# Patient Record
Sex: Female | Born: 1970 | Race: White | Hispanic: No | Marital: Married
Health system: Southern US, Community
[De-identification: ages and names within clinical notes are randomized; demographics above are authoritative.]

## PROBLEM LIST (undated history)

## (undated) DIAGNOSIS — G2581 Restless legs syndrome: Secondary | ICD-10-CM

## (undated) DIAGNOSIS — Z803 Family history of malignant neoplasm of breast: Secondary | ICD-10-CM

## (undated) DIAGNOSIS — K8020 Calculus of gallbladder without cholecystitis without obstruction: Secondary | ICD-10-CM

## (undated) DIAGNOSIS — K219 Gastro-esophageal reflux disease without esophagitis: Secondary | ICD-10-CM

## (undated) DIAGNOSIS — Z801 Family history of malignant neoplasm of trachea, bronchus and lung: Secondary | ICD-10-CM

## (undated) DIAGNOSIS — Z808 Family history of malignant neoplasm of other organs or systems: Secondary | ICD-10-CM

## (undated) DIAGNOSIS — Z8042 Family history of malignant neoplasm of prostate: Secondary | ICD-10-CM

## (undated) HISTORY — DX: Family history of malignant neoplasm of prostate: Z80.42

## (undated) HISTORY — DX: Family history of malignant neoplasm of breast: Z80.3

## (undated) HISTORY — DX: Family history of malignant neoplasm of trachea, bronchus and lung: Z80.1

## (undated) HISTORY — DX: Calculus of gallbladder without cholecystitis without obstruction: K80.20

## (undated) HISTORY — PX: CHOLECYSTECTOMY: SHX55

## (undated) HISTORY — PX: GANGLION CYST EXCISION: SHX1691

## (undated) HISTORY — DX: Family history of malignant neoplasm of other organs or systems: Z80.8

---

## 1998-06-06 ENCOUNTER — Encounter (HOSPITAL_COMMUNITY): Admission: RE | Admit: 1998-06-06 | Discharge: 1998-07-04 | Payer: Self-pay | Admitting: Obstetrics and Gynecology

## 1998-06-12 ENCOUNTER — Ambulatory Visit (HOSPITAL_COMMUNITY): Admission: RE | Admit: 1998-06-12 | Discharge: 1998-06-12 | Payer: Self-pay | Admitting: Obstetrics and Gynecology

## 1998-07-02 ENCOUNTER — Inpatient Hospital Stay (HOSPITAL_COMMUNITY): Admission: AD | Admit: 1998-07-02 | Discharge: 1998-07-02 | Payer: Self-pay | Admitting: Obstetrics and Gynecology

## 1998-07-03 ENCOUNTER — Inpatient Hospital Stay (HOSPITAL_COMMUNITY): Admission: AD | Admit: 1998-07-03 | Discharge: 1998-07-05 | Payer: Self-pay | Admitting: Obstetrics and Gynecology

## 1998-07-07 ENCOUNTER — Encounter (HOSPITAL_COMMUNITY): Admission: RE | Admit: 1998-07-07 | Discharge: 1998-10-05 | Payer: Self-pay | Admitting: *Deleted

## 1999-07-06 ENCOUNTER — Emergency Department (HOSPITAL_COMMUNITY): Admission: EM | Admit: 1999-07-06 | Discharge: 1999-07-06 | Payer: Self-pay | Admitting: Emergency Medicine

## 1999-07-17 ENCOUNTER — Other Ambulatory Visit: Admission: RE | Admit: 1999-07-17 | Discharge: 1999-07-17 | Payer: Self-pay | Admitting: Obstetrics and Gynecology

## 2000-10-05 ENCOUNTER — Observation Stay (HOSPITAL_COMMUNITY): Admission: AD | Admit: 2000-10-05 | Discharge: 2000-10-06 | Payer: Self-pay | Admitting: *Deleted

## 2000-10-08 ENCOUNTER — Other Ambulatory Visit: Admission: RE | Admit: 2000-10-08 | Discharge: 2000-10-08 | Payer: Self-pay | Admitting: Obstetrics and Gynecology

## 2000-10-20 ENCOUNTER — Inpatient Hospital Stay (HOSPITAL_COMMUNITY): Admission: AD | Admit: 2000-10-20 | Discharge: 2000-10-20 | Payer: Self-pay | Admitting: Obstetrics and Gynecology

## 2000-11-26 ENCOUNTER — Ambulatory Visit (HOSPITAL_COMMUNITY): Admission: RE | Admit: 2000-11-26 | Discharge: 2000-11-26 | Payer: Self-pay | Admitting: Obstetrics and Gynecology

## 2000-11-26 ENCOUNTER — Encounter: Payer: Self-pay | Admitting: Obstetrics and Gynecology

## 2001-04-24 ENCOUNTER — Inpatient Hospital Stay (HOSPITAL_COMMUNITY): Admission: AD | Admit: 2001-04-24 | Discharge: 2001-04-26 | Payer: Self-pay | Admitting: Obstetrics and Gynecology

## 2002-10-01 ENCOUNTER — Other Ambulatory Visit: Admission: RE | Admit: 2002-10-01 | Discharge: 2002-10-01 | Payer: Self-pay | Admitting: Obstetrics and Gynecology

## 2003-11-03 ENCOUNTER — Other Ambulatory Visit: Admission: RE | Admit: 2003-11-03 | Discharge: 2003-11-03 | Payer: Self-pay | Admitting: Obstetrics and Gynecology

## 2004-11-14 ENCOUNTER — Other Ambulatory Visit: Admission: RE | Admit: 2004-11-14 | Discharge: 2004-11-14 | Payer: Self-pay | Admitting: Obstetrics and Gynecology

## 2005-12-02 ENCOUNTER — Other Ambulatory Visit: Admission: RE | Admit: 2005-12-02 | Discharge: 2005-12-02 | Payer: Self-pay | Admitting: Obstetrics and Gynecology

## 2007-07-07 ENCOUNTER — Encounter: Admission: RE | Admit: 2007-07-07 | Discharge: 2007-07-07 | Payer: Self-pay | Admitting: Obstetrics and Gynecology

## 2011-04-12 NOTE — Discharge Summary (Signed)
[facility]  Patient:    Kristina Hudson, Kristina Hudson                        MRN: [id number] Adm. Date:  [id number] Disc. Date: 04/26/01 Attending:  Michaele Offer                           Discharge Summary  ADMISSION DIAGNOSES:          Intrauterine pregnancy at 39 weeks.  DISCHARGE DIAGNOSES:          Intrauterine pregnancy at 39 weeks, delivered.  PROCEDURE:                    Spontaneous vaginal delivery.  COMPLICATIONS:                None.  CONSULTATIONS:                None.  HISTORY AND PHYSICAL:         This is a 40 year old white female gravida 2, para 1-0-0-2 with an EGA of 39+ weeks by an LMP consistent with a 9 week ultrasound with an EDC of June 4 who presents with a complaint of regular contractions without bleeding or ruptured membranes and with good fetal movement.  Prenatal care complicated by nausea and vomiting of pregnancy treated with Zofran, anemia treated with iron, and constipation treated with Colace.  She was also treated with a Z-pak at 24 weeks for persistent cough.  PRENATAL LABORATORIES:        Blood type O+.  Negative antibody screen.  RPR nonreactive.  Rubella immune.  Hepatitis B surface antigen negative.  HIV negative.  Gonorrhea and chlamydia negative.  Triple screen normal.  One hour glucola 96.  Group B strep is negative.  PAST OBSTETRICAL HISTORY:     In 1999 vaginal delivery at 39 weeks 5 pounds 12 ounces with no complications.  PAST GYNECOLOGICAL HISTORY:   Colposcopy in 1993.  PAST MEDICAL HISTORY:         Anemia.  PAST SURGICAL HISTORY:        Ganglion cyst of the left wrist.  ALLERGIES:                    CODEINE and IODINE.  MEDICATIONS:                  Zofran 8 mg approximately q.48h.  PHYSICAL EXAMINATION  VITAL SIGNS:                  She is afebrile with stable vital signs. Fetal heart tracing is reactive with contractions every two to three minutes.  ABDOMEN:                      Gravid,  nontender with a fundal height of 36 cm on May 23 and an estimated fetal weight of approximately 7 pounds.  PELVIC:                       Vaginal examination per the nurses was 5-6, 90, and a -1 on admission which progressed to 6, 90, and 0.  She had an intact bag of water and an adequate pelvis and a vertex presentation.  HOSPITAL COURSE:              Patient was admitted and received an epidural.  She continued to progress and had spontaneous rupture of membranes.  She reached complete, pushed well and on the afternoon of May 31 had an SVD of a vigorous female infant with Apgars of 8 and 9 that weighed 6 pounds 7 ounces. There was a nuchal cord x 1 that was reduced.  The placenta delivered spontaneous and was intact.  Perineum, cervix, and rectum were intact and estimated blood loss was approximately 200 cc.  Delivery was performed by Dr. Huel Cote.  Postpartum she did very well.  Breast-fed her baby with complication.  Remained afebrile.  On the morning of postpartum day #2 was felt to be stable enough for discharge home.  CONDITION ON DISCHARGE:       Stable.  DISPOSITION:                  Discharged to home.  DIET:                         Regular diet.  ACTIVITY:                     Pelvic rest.  FOLLOW-UP:                    Six weeks.  DISCHARGE MEDICATIONS:        Over-the-counter Motrin p.r.n.  DISCHARGE INSTRUCTIONS:       She is given our discharge pamphlet. DD:  04/26/01 TD:  04/26/01 Job: [id number]

## 2018-06-29 DIAGNOSIS — Z6829 Body mass index (BMI) 29.0-29.9, adult: Secondary | ICD-10-CM | POA: Diagnosis not present

## 2018-06-29 DIAGNOSIS — K117 Disturbances of salivary secretion: Secondary | ICD-10-CM | POA: Diagnosis not present

## 2018-06-29 DIAGNOSIS — B370 Candidal stomatitis: Secondary | ICD-10-CM | POA: Diagnosis not present

## 2018-07-22 DIAGNOSIS — K146 Glossodynia: Secondary | ICD-10-CM | POA: Diagnosis not present

## 2018-07-22 DIAGNOSIS — K140 Glossitis: Secondary | ICD-10-CM | POA: Diagnosis not present

## 2018-08-13 DIAGNOSIS — K146 Glossodynia: Secondary | ICD-10-CM | POA: Diagnosis not present

## 2018-08-13 DIAGNOSIS — G9009 Other idiopathic peripheral autonomic neuropathy: Secondary | ICD-10-CM | POA: Diagnosis not present

## 2018-08-13 DIAGNOSIS — B370 Candidal stomatitis: Secondary | ICD-10-CM | POA: Diagnosis not present

## 2018-08-13 DIAGNOSIS — G521 Disorders of glossopharyngeal nerve: Secondary | ICD-10-CM | POA: Diagnosis not present

## 2018-08-19 DIAGNOSIS — Z1231 Encounter for screening mammogram for malignant neoplasm of breast: Secondary | ICD-10-CM | POA: Diagnosis not present

## 2018-09-08 DIAGNOSIS — B370 Candidal stomatitis: Secondary | ICD-10-CM | POA: Diagnosis not present

## 2018-09-08 DIAGNOSIS — K146 Glossodynia: Secondary | ICD-10-CM | POA: Diagnosis not present

## 2018-09-08 DIAGNOSIS — K117 Disturbances of salivary secretion: Secondary | ICD-10-CM | POA: Diagnosis not present

## 2018-09-08 DIAGNOSIS — G521 Disorders of glossopharyngeal nerve: Secondary | ICD-10-CM | POA: Diagnosis not present

## 2018-09-14 DIAGNOSIS — Z23 Encounter for immunization: Secondary | ICD-10-CM | POA: Diagnosis not present

## 2019-02-17 DIAGNOSIS — L814 Other melanin hyperpigmentation: Secondary | ICD-10-CM | POA: Diagnosis not present

## 2019-02-17 DIAGNOSIS — D225 Melanocytic nevi of trunk: Secondary | ICD-10-CM | POA: Diagnosis not present

## 2019-02-17 DIAGNOSIS — Z808 Family history of malignant neoplasm of other organs or systems: Secondary | ICD-10-CM | POA: Diagnosis not present

## 2019-02-17 DIAGNOSIS — L821 Other seborrheic keratosis: Secondary | ICD-10-CM | POA: Diagnosis not present

## 2019-06-30 DIAGNOSIS — Z6829 Body mass index (BMI) 29.0-29.9, adult: Secondary | ICD-10-CM | POA: Diagnosis not present

## 2019-06-30 DIAGNOSIS — N870 Mild cervical dysplasia: Secondary | ICD-10-CM | POA: Diagnosis not present

## 2019-06-30 DIAGNOSIS — Z01419 Encounter for gynecological examination (general) (routine) without abnormal findings: Secondary | ICD-10-CM | POA: Diagnosis not present

## 2019-06-30 DIAGNOSIS — R05 Cough: Secondary | ICD-10-CM | POA: Diagnosis not present

## 2019-06-30 DIAGNOSIS — Z1231 Encounter for screening mammogram for malignant neoplasm of breast: Secondary | ICD-10-CM | POA: Diagnosis not present

## 2019-06-30 DIAGNOSIS — N3946 Mixed incontinence: Secondary | ICD-10-CM | POA: Diagnosis not present

## 2019-06-30 DIAGNOSIS — K219 Gastro-esophageal reflux disease without esophagitis: Secondary | ICD-10-CM | POA: Diagnosis not present

## 2019-08-10 DIAGNOSIS — R05 Cough: Secondary | ICD-10-CM | POA: Diagnosis not present

## 2019-08-10 DIAGNOSIS — K219 Gastro-esophageal reflux disease without esophagitis: Secondary | ICD-10-CM | POA: Diagnosis not present

## 2019-08-31 DIAGNOSIS — Z1231 Encounter for screening mammogram for malignant neoplasm of breast: Secondary | ICD-10-CM | POA: Diagnosis not present

## 2019-09-14 DIAGNOSIS — Z20828 Contact with and (suspected) exposure to other viral communicable diseases: Secondary | ICD-10-CM | POA: Diagnosis not present

## 2019-09-23 DIAGNOSIS — Z23 Encounter for immunization: Secondary | ICD-10-CM | POA: Diagnosis not present

## 2020-01-20 DIAGNOSIS — Z23 Encounter for immunization: Secondary | ICD-10-CM | POA: Diagnosis not present

## 2020-02-17 DIAGNOSIS — Z23 Encounter for immunization: Secondary | ICD-10-CM | POA: Diagnosis not present

## 2020-05-30 DIAGNOSIS — F419 Anxiety disorder, unspecified: Secondary | ICD-10-CM | POA: Diagnosis not present

## 2020-05-30 DIAGNOSIS — N649 Disorder of breast, unspecified: Secondary | ICD-10-CM | POA: Diagnosis not present

## 2020-05-30 DIAGNOSIS — G2581 Restless legs syndrome: Secondary | ICD-10-CM | POA: Diagnosis not present

## 2020-05-31 ENCOUNTER — Other Ambulatory Visit: Payer: Self-pay | Admitting: Family Medicine

## 2020-05-31 DIAGNOSIS — N649 Disorder of breast, unspecified: Secondary | ICD-10-CM

## 2020-06-19 ENCOUNTER — Other Ambulatory Visit: Payer: Self-pay

## 2020-07-04 DIAGNOSIS — D500 Iron deficiency anemia secondary to blood loss (chronic): Secondary | ICD-10-CM | POA: Diagnosis not present

## 2020-07-04 DIAGNOSIS — Z0000 Encounter for general adult medical examination without abnormal findings: Secondary | ICD-10-CM | POA: Diagnosis not present

## 2020-07-04 DIAGNOSIS — Z13220 Encounter for screening for lipoid disorders: Secondary | ICD-10-CM | POA: Diagnosis not present

## 2020-07-07 DIAGNOSIS — D500 Iron deficiency anemia secondary to blood loss (chronic): Secondary | ICD-10-CM | POA: Diagnosis not present

## 2020-08-07 DIAGNOSIS — Z20828 Contact with and (suspected) exposure to other viral communicable diseases: Secondary | ICD-10-CM | POA: Diagnosis not present

## 2020-08-28 ENCOUNTER — Other Ambulatory Visit: Payer: Self-pay | Admitting: Family Medicine

## 2020-08-28 DIAGNOSIS — Z1231 Encounter for screening mammogram for malignant neoplasm of breast: Secondary | ICD-10-CM

## 2020-09-20 ENCOUNTER — Ambulatory Visit
Admission: RE | Admit: 2020-09-20 | Discharge: 2020-09-20 | Disposition: A | Discharge status: Home / Self Care | Payer: BC Managed Care – PPO | Source: Ambulatory Visit | Attending: Family Medicine | Admitting: Family Medicine

## 2020-09-20 ENCOUNTER — Other Ambulatory Visit: Payer: Self-pay

## 2020-09-20 DIAGNOSIS — Z1231 Encounter for screening mammogram for malignant neoplasm of breast: Secondary | ICD-10-CM

## 2021-01-15 DIAGNOSIS — F419 Anxiety disorder, unspecified: Secondary | ICD-10-CM | POA: Diagnosis not present

## 2021-01-15 DIAGNOSIS — D500 Iron deficiency anemia secondary to blood loss (chronic): Secondary | ICD-10-CM | POA: Diagnosis not present

## 2021-07-11 DIAGNOSIS — G2581 Restless legs syndrome: Secondary | ICD-10-CM | POA: Diagnosis not present

## 2021-07-11 DIAGNOSIS — F419 Anxiety disorder, unspecified: Secondary | ICD-10-CM | POA: Diagnosis not present

## 2021-07-11 DIAGNOSIS — D500 Iron deficiency anemia secondary to blood loss (chronic): Secondary | ICD-10-CM | POA: Diagnosis not present

## 2021-07-11 DIAGNOSIS — L989 Disorder of the skin and subcutaneous tissue, unspecified: Secondary | ICD-10-CM | POA: Diagnosis not present

## 2021-07-11 DIAGNOSIS — Z13220 Encounter for screening for lipoid disorders: Secondary | ICD-10-CM | POA: Diagnosis not present

## 2021-07-11 DIAGNOSIS — Z0000 Encounter for general adult medical examination without abnormal findings: Secondary | ICD-10-CM | POA: Diagnosis not present

## 2021-07-11 DIAGNOSIS — Z1159 Encounter for screening for other viral diseases: Secondary | ICD-10-CM | POA: Diagnosis not present

## 2021-07-19 DIAGNOSIS — Z20822 Contact with and (suspected) exposure to covid-19: Secondary | ICD-10-CM | POA: Diagnosis not present

## 2021-07-19 DIAGNOSIS — J029 Acute pharyngitis, unspecified: Secondary | ICD-10-CM | POA: Diagnosis not present

## 2021-07-23 ENCOUNTER — Other Ambulatory Visit: Payer: Self-pay | Admitting: Family Medicine

## 2021-07-23 DIAGNOSIS — Z1231 Encounter for screening mammogram for malignant neoplasm of breast: Secondary | ICD-10-CM

## 2021-08-20 DIAGNOSIS — Z1211 Encounter for screening for malignant neoplasm of colon: Secondary | ICD-10-CM | POA: Diagnosis not present

## 2021-09-17 DIAGNOSIS — L821 Other seborrheic keratosis: Secondary | ICD-10-CM | POA: Diagnosis not present

## 2021-09-17 DIAGNOSIS — L568 Other specified acute skin changes due to ultraviolet radiation: Secondary | ICD-10-CM | POA: Diagnosis not present

## 2021-09-17 DIAGNOSIS — D225 Melanocytic nevi of trunk: Secondary | ICD-10-CM | POA: Diagnosis not present

## 2021-09-17 DIAGNOSIS — D485 Neoplasm of uncertain behavior of skin: Secondary | ICD-10-CM | POA: Diagnosis not present

## 2021-09-24 ENCOUNTER — Other Ambulatory Visit: Payer: Self-pay

## 2021-09-24 ENCOUNTER — Ambulatory Visit
Admission: RE | Admit: 2021-09-24 | Discharge: 2021-09-24 | Disposition: A | Discharge status: Home / Self Care | Payer: BC Managed Care – PPO | Source: Ambulatory Visit | Attending: Family Medicine | Admitting: Family Medicine

## 2021-09-24 DIAGNOSIS — Z1231 Encounter for screening mammogram for malignant neoplasm of breast: Secondary | ICD-10-CM

## 2022-04-16 IMAGING — MG MM DIGITAL SCREENING BILAT W/ TOMO AND CAD
8 series · 8 of 24 positions shown · non-contrast
Comparison: Previous exam(s).

CLINICAL DATA: Screening.

EXAM:
DIGITAL SCREENING BILATERAL MAMMOGRAM WITH TOMOSYNTHESIS AND CAD
TECHNIQUE: Bilateral screening digital craniocaudal and mediolateral oblique
mammograms were obtained. Bilateral screening digital breast
tomosynthesis was performed. The images were evaluated with
computer-aided detection.

[L CC synth-2D]
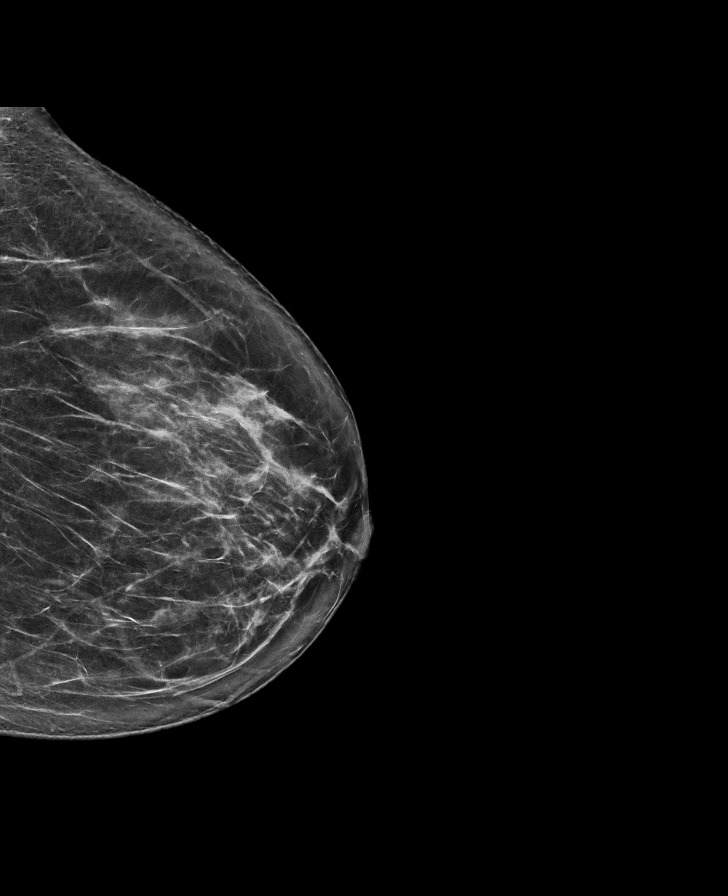

[R MLO synth-2D]
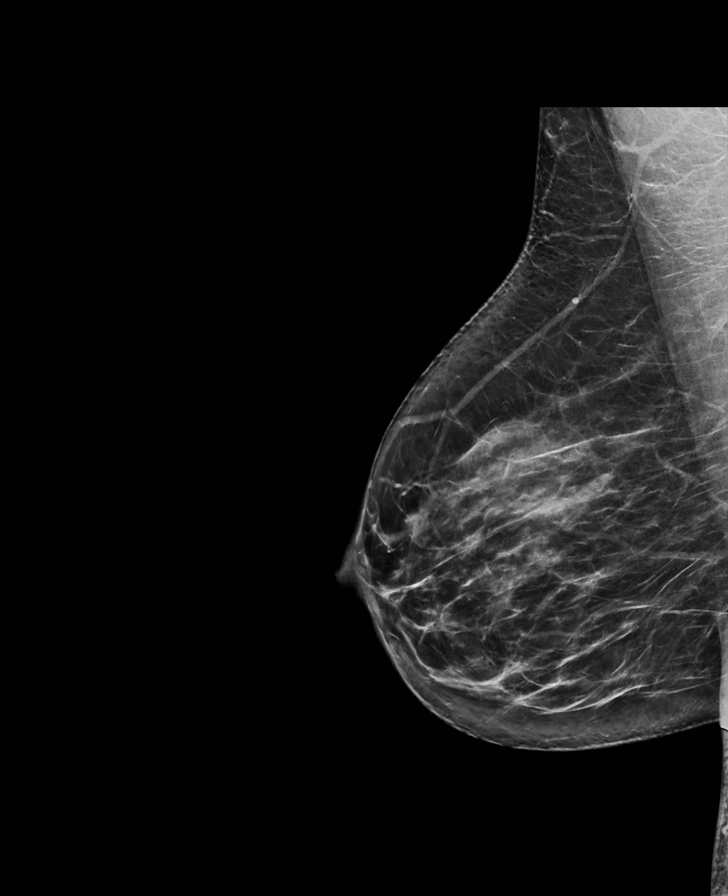

[L MLO synth-2D]
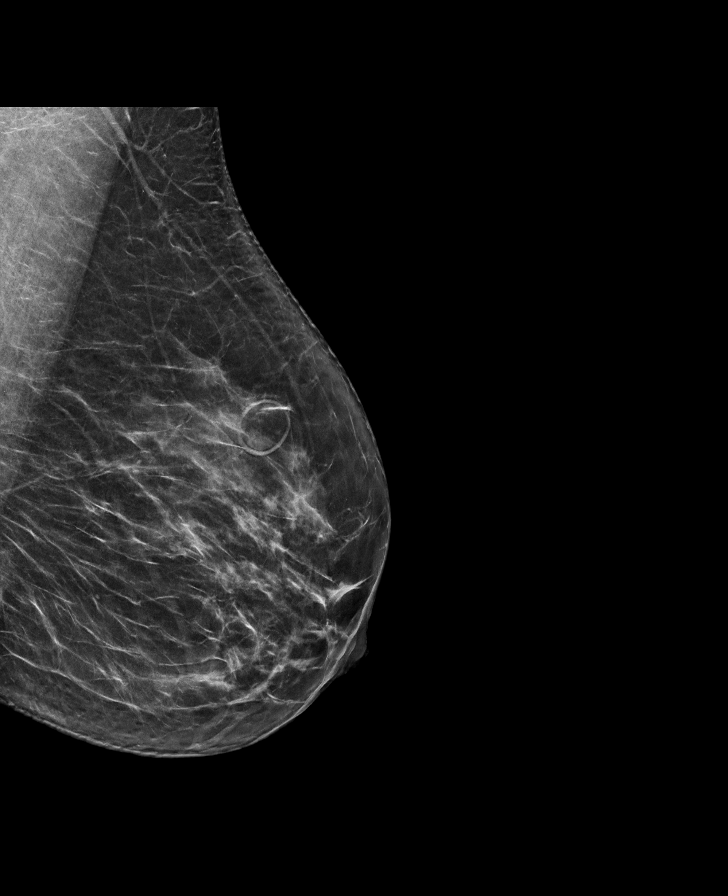

[R CC synth-2D]
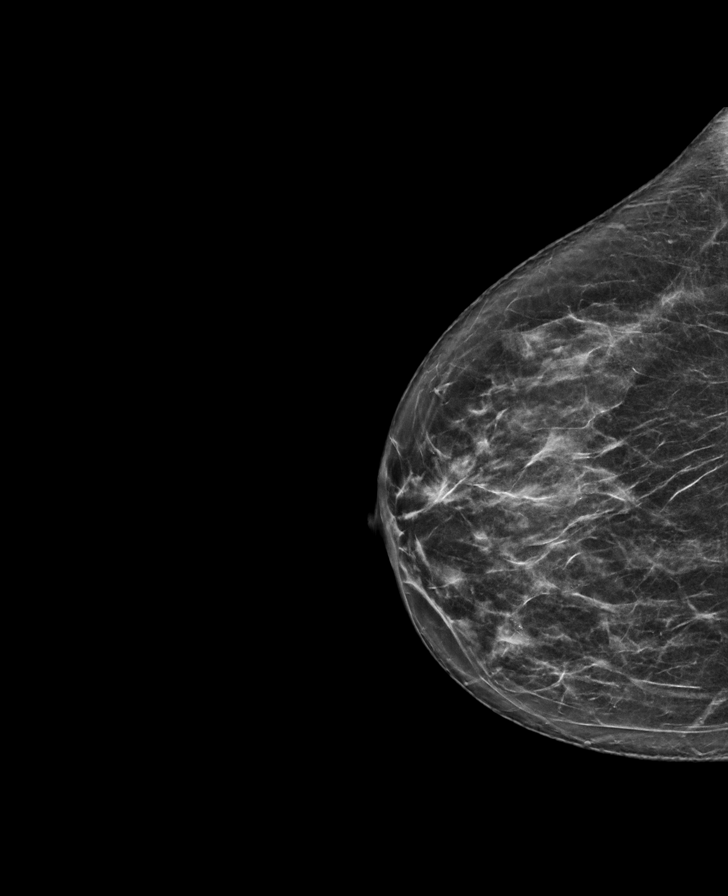

[L MLO tomo · tomo slice 37/73.0]
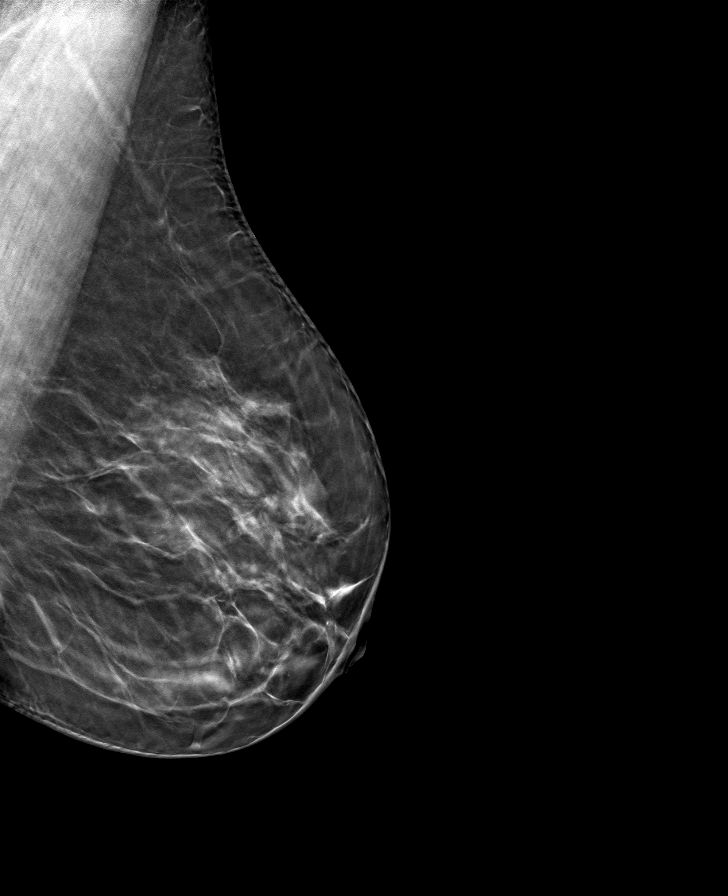

[R MLO tomo · tomo slice 38/75.0]
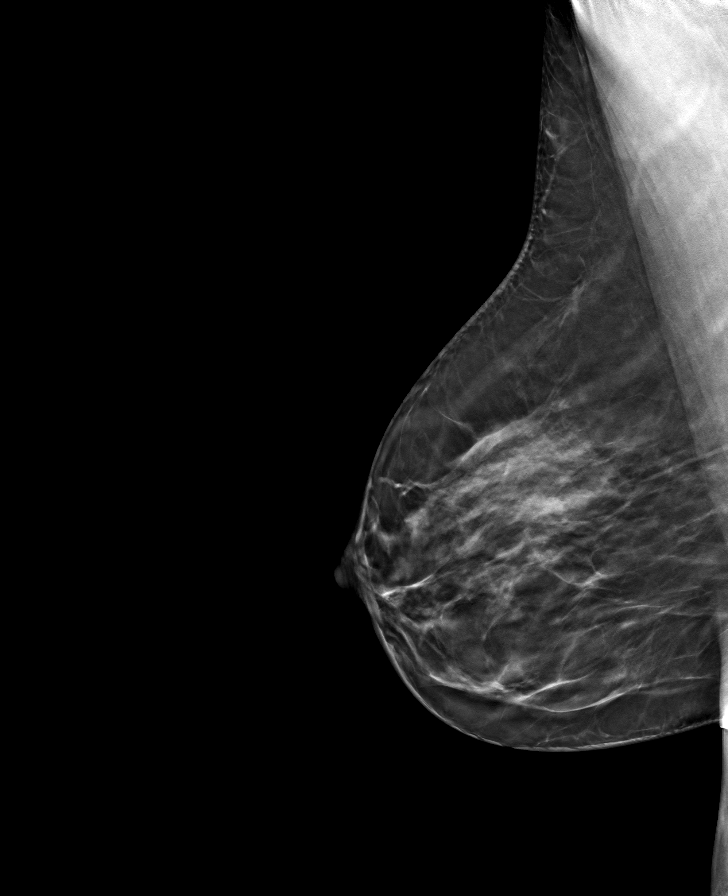

[L CC tomo · tomo slice 37/74.0]
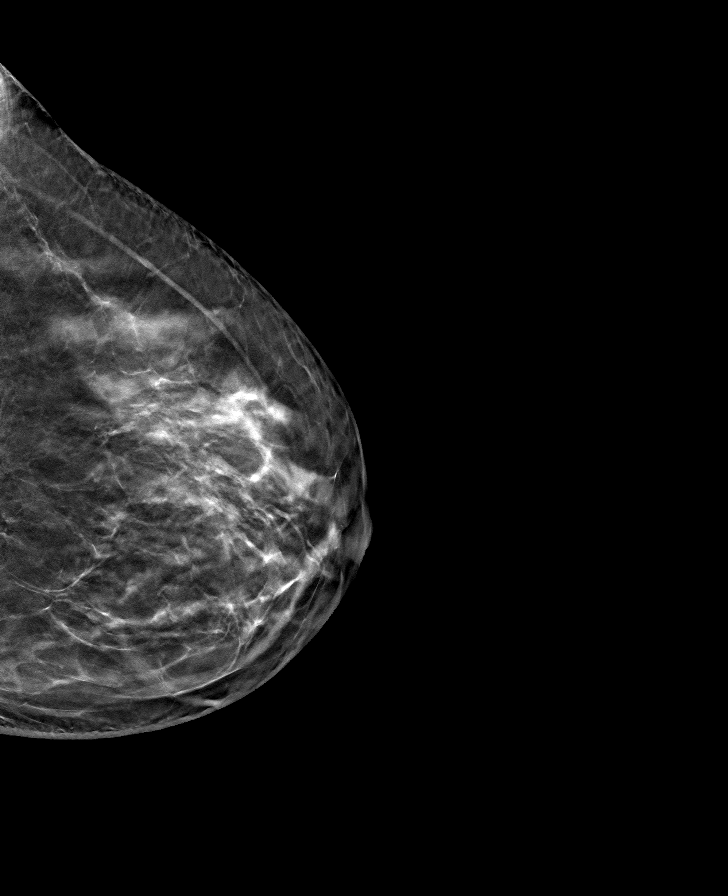

[R CC tomo · tomo slice 35/68.0]
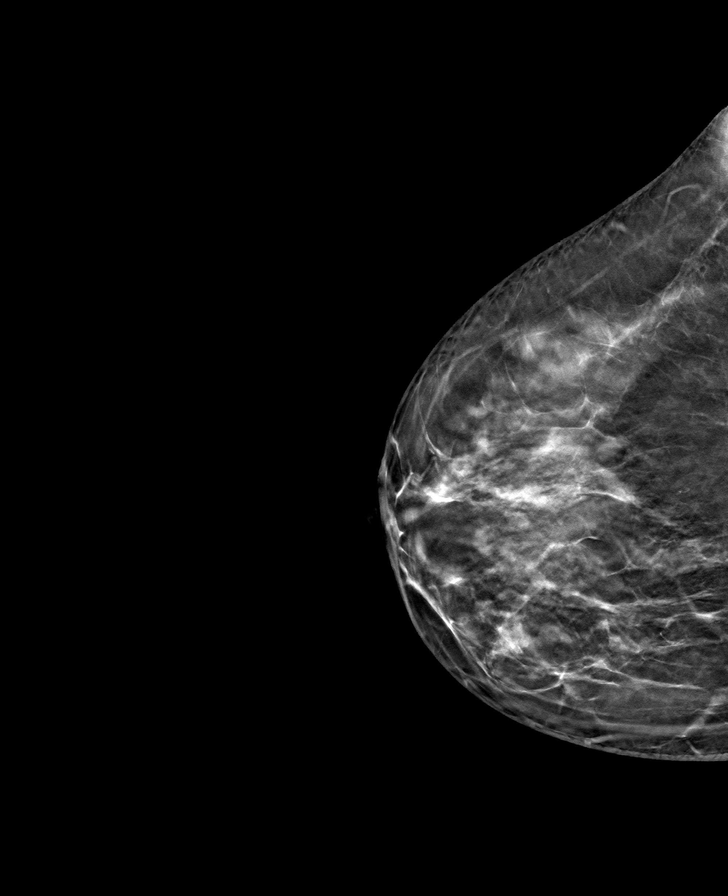

[8 of 24 positions shown; findings below may reference images not displayed]

ACR Breast Density Category c: The breast tissue is heterogeneously
dense, which may obscure small masses.
FINDINGS: There are no findings suspicious for malignancy.
IMPRESSION: No mammographic evidence of malignancy. A result letter of this
screening mammogram will be mailed directly to the patient.

RECOMMENDATION:
Screening mammogram in one year. (Code:Q3-W-BC3)

BI-RADS CATEGORY  1: Negative.

## 2022-07-18 DIAGNOSIS — G2581 Restless legs syndrome: Secondary | ICD-10-CM | POA: Diagnosis not present

## 2022-07-18 DIAGNOSIS — Z0000 Encounter for general adult medical examination without abnormal findings: Secondary | ICD-10-CM | POA: Diagnosis not present

## 2022-07-18 DIAGNOSIS — R635 Abnormal weight gain: Secondary | ICD-10-CM | POA: Diagnosis not present

## 2022-07-18 DIAGNOSIS — F419 Anxiety disorder, unspecified: Secondary | ICD-10-CM | POA: Diagnosis not present

## 2022-07-18 DIAGNOSIS — D509 Iron deficiency anemia, unspecified: Secondary | ICD-10-CM | POA: Diagnosis not present

## 2022-08-21 ENCOUNTER — Other Ambulatory Visit: Payer: Self-pay | Admitting: Family Medicine

## 2022-08-21 DIAGNOSIS — Z1231 Encounter for screening mammogram for malignant neoplasm of breast: Secondary | ICD-10-CM

## 2022-09-27 ENCOUNTER — Ambulatory Visit
Admission: RE | Admit: 2022-09-27 | Discharge: 2022-09-27 | Disposition: A | Discharge status: Home / Self Care | Payer: BC Managed Care – PPO | Source: Ambulatory Visit | Attending: Family Medicine | Admitting: Family Medicine

## 2022-09-27 DIAGNOSIS — Z1231 Encounter for screening mammogram for malignant neoplasm of breast: Secondary | ICD-10-CM | POA: Diagnosis not present

## 2022-12-20 DIAGNOSIS — Z1211 Encounter for screening for malignant neoplasm of colon: Secondary | ICD-10-CM | POA: Diagnosis not present

## 2022-12-20 DIAGNOSIS — M79672 Pain in left foot: Secondary | ICD-10-CM | POA: Diagnosis not present

## 2022-12-20 DIAGNOSIS — L608 Other nail disorders: Secondary | ICD-10-CM | POA: Diagnosis not present

## 2022-12-20 DIAGNOSIS — R946 Abnormal results of thyroid function studies: Secondary | ICD-10-CM | POA: Diagnosis not present

## 2022-12-20 DIAGNOSIS — G2581 Restless legs syndrome: Secondary | ICD-10-CM | POA: Diagnosis not present

## 2022-12-20 DIAGNOSIS — F419 Anxiety disorder, unspecified: Secondary | ICD-10-CM | POA: Diagnosis not present

## 2022-12-24 DIAGNOSIS — Z1283 Encounter for screening for malignant neoplasm of skin: Secondary | ICD-10-CM | POA: Diagnosis not present

## 2022-12-24 DIAGNOSIS — R208 Other disturbances of skin sensation: Secondary | ICD-10-CM | POA: Diagnosis not present

## 2022-12-24 DIAGNOSIS — B078 Other viral warts: Secondary | ICD-10-CM | POA: Diagnosis not present

## 2022-12-24 DIAGNOSIS — D225 Melanocytic nevi of trunk: Secondary | ICD-10-CM | POA: Diagnosis not present

## 2022-12-31 ENCOUNTER — Ambulatory Visit: Payer: BC Managed Care – PPO | Admitting: Podiatry

## 2022-12-31 ENCOUNTER — Ambulatory Visit (INDEPENDENT_AMBULATORY_CARE_PROVIDER_SITE_OTHER): Payer: BC Managed Care – PPO | Admitting: Podiatry

## 2022-12-31 ENCOUNTER — Encounter: Payer: Self-pay | Admitting: Podiatry

## 2022-12-31 ENCOUNTER — Ambulatory Visit (INDEPENDENT_AMBULATORY_CARE_PROVIDER_SITE_OTHER): Payer: BC Managed Care – PPO

## 2022-12-31 DIAGNOSIS — M7662 Achilles tendinitis, left leg: Secondary | ICD-10-CM

## 2022-12-31 DIAGNOSIS — M79671 Pain in right foot: Secondary | ICD-10-CM | POA: Diagnosis not present

## 2022-12-31 DIAGNOSIS — M7661 Achilles tendinitis, right leg: Secondary | ICD-10-CM | POA: Diagnosis not present

## 2022-12-31 DIAGNOSIS — M79672 Pain in left foot: Secondary | ICD-10-CM

## 2022-12-31 DIAGNOSIS — B351 Tinea unguium: Secondary | ICD-10-CM

## 2022-12-31 MED ORDER — TERBINAFINE HCL 250 MG PO TABS: 250.0000 mg | ORAL_TABLET | Freq: Every day | ORAL | 0 refills | Status: AC

## 2022-12-31 MED ORDER — MELOXICAM 15 MG PO TABS: 15.0000 mg | ORAL_TABLET | Freq: Every day | ORAL | 0 refills | Status: DC

## 2022-12-31 NOTE — Progress Notes (Signed)
  Subjective:  Patient ID: Kristina Hudson, female    DOB: 01-Mar-1971,   MRN: [id number]  Chief Complaint  Patient presents with   Foot Pain    Left foot heel pain, started     52 y.o. female presents for bilateral heel pain that has been going on for about 6 months. Relates both are about the same and most painful first steps in the morning and after being on them for a while. Has tried some stretching and inserts with minimal relief. Does relates some pain on the bottom lfet foot arch as well . Denies any other pedal complaints. Denies n/v/f/c.   History reviewed. No pertinent past medical history.  Objective:  Physical Exam: Vascular: DP/PT pulses 2/4 bilateral. CFT <3 seconds. Normal hair growth on digits. No edema.  Skin. No lacerations or abrasions bilateral feet. Left hallux nail thickened and dsicolored.  Musculoskeletal: MMT 5/5 bilateral lower extremities in DF, PF, Inversion and Eversion. Deceased ROM in DF of ankle joint. Tender to watershed area of achilles tendon bilteral. No pain with PF and DF. No palpable delve. Negative thompsons sign.   Neurological: Sensation intact to light touch.   Assessment:   1. Achilles tendonitis, bilateral   2. Onychomycosis      Plan:  Patient was evaluated and treated and all questions answered. -Xrays reviewed. Spurring noted to posterior calcaneus bilateral -Discussed Achilles insertional tendonitis and treatment options with patient.  -Discussed stretching exercises. -Rx Meloxicam provided  -Heel lifts provided and discussed proper shoewear.  -Discussed if no improvement will consider MRI/PT/EPAT/PRP injections.  -Patient to return to office 6 weeks for check.   -Examined patient -Discussed treatment options for painful dystrophic nails  -Discussed fungal nail treatment options including oral, topical, and laser treatments.  -Lamisl prescribed.      Lorenda Peck, DPM

## 2022-12-31 NOTE — Patient Instructions (Signed)

## 2023-01-02 ENCOUNTER — Other Ambulatory Visit: Payer: Self-pay | Admitting: Podiatry

## 2023-01-02 DIAGNOSIS — M7661 Achilles tendinitis, right leg: Secondary | ICD-10-CM

## 2023-01-02 DIAGNOSIS — M79672 Pain in left foot: Secondary | ICD-10-CM

## 2023-01-02 DIAGNOSIS — B351 Tinea unguium: Secondary | ICD-10-CM

## 2023-01-02 DIAGNOSIS — M79671 Pain in right foot: Secondary | ICD-10-CM

## 2023-01-27 ENCOUNTER — Other Ambulatory Visit: Payer: Self-pay | Admitting: Podiatry

## 2023-02-07 DIAGNOSIS — Z9889 Other specified postprocedural states: Secondary | ICD-10-CM | POA: Diagnosis not present

## 2023-02-07 DIAGNOSIS — H524 Presbyopia: Secondary | ICD-10-CM | POA: Diagnosis not present

## 2023-02-07 DIAGNOSIS — H40013 Open angle with borderline findings, low risk, bilateral: Secondary | ICD-10-CM | POA: Diagnosis not present

## 2023-02-07 DIAGNOSIS — H20041 Secondary noninfectious iridocyclitis, right eye: Secondary | ICD-10-CM | POA: Diagnosis not present

## 2023-02-11 ENCOUNTER — Ambulatory Visit (INDEPENDENT_AMBULATORY_CARE_PROVIDER_SITE_OTHER): Payer: BC Managed Care – PPO | Admitting: Podiatry

## 2023-02-11 ENCOUNTER — Encounter: Payer: Self-pay | Admitting: Podiatry

## 2023-02-11 DIAGNOSIS — B351 Tinea unguium: Secondary | ICD-10-CM | POA: Diagnosis not present

## 2023-02-11 DIAGNOSIS — M7662 Achilles tendinitis, left leg: Secondary | ICD-10-CM

## 2023-02-11 DIAGNOSIS — M7661 Achilles tendinitis, right leg: Secondary | ICD-10-CM | POA: Diagnosis not present

## 2023-02-11 NOTE — Progress Notes (Signed)
  Subjective:  Patient ID: Kristina Hudson, female    DOB: February 04, 1971,   MRN: [id number]  Chief Complaint  Patient presents with   Tendinitis    Rm 22 6 weeks follow up bil foot pain. Pt states there is improvement but still has some pain when driving on her right achilles.    Nail Problem    Left great nail fungus. Pt states there is a little improvement but has concerns with the medial hallux darkness. Nail is lifting from nail bed some. Pt okay to have nail trimmed down.     52 y.o. female presents for follow-up of bilateral achilles tendonitis. Relates doing about 60% better. Has been stretching and taking meloxicam and using lifts. Relates she will feel better and forget to stretch and pain will start to come back. Mostly right still hurting. Relates the nail may be improving a little but concerned for a new area.  Denies any other pedal complaints. Denies n/v/f/c.   No past medical history on file.  Objective:  Physical Exam: Vascular: DP/PT pulses 2/4 bilateral. CFT <3 seconds. Normal hair growth on digits. No edema.  Skin. No lacerations or abrasions bilateral feet. Left hallux nail thickened and dsicolored.  Musculoskeletal: MMT 5/5 bilateral lower extremities in DF, PF, Inversion and Eversion. Deceased ROM in DF of ankle joint. Tender to watershed area of achilles tendon bilteral. No pain with PF and DF. No palpable delve. Negative thompsons sign. Mostly on right.   Neurological: Sensation intact to light touch.   Assessment:   1. Achilles tendonitis, bilateral   2. Onychomycosis       Plan:  Patient was evaluated and treated and all questions answered. -Xrays reviewed. Spurring noted to posterior calcaneus bilateral -Discussed Achilles insertional tendonitis and treatment options with patient.  Continue stretching and heel lifts.  -Rx Meloxicam provided  -Discussed if no improvement will consider MRI/PT/EPAT/PRP injections.  -Patient to return to office 6 weeks for  check.   -Examined patient -Discussed treatment options for painful dystrophic nails  -Discussed fungal nail treatment options including oral, topical, and laser treatments.  -Continue on lamisil  Follow-up in 6 weeks for recheck.       Lorenda Peck, DPM

## 2023-02-13 DIAGNOSIS — S61459A Open bite of unspecified hand, initial encounter: Secondary | ICD-10-CM | POA: Diagnosis not present

## 2023-02-13 DIAGNOSIS — S61255A Open bite of left ring finger without damage to nail, initial encounter: Secondary | ICD-10-CM | POA: Diagnosis not present

## 2023-03-25 ENCOUNTER — Ambulatory Visit (INDEPENDENT_AMBULATORY_CARE_PROVIDER_SITE_OTHER): Payer: BC Managed Care – PPO | Admitting: Podiatry

## 2023-03-25 ENCOUNTER — Encounter: Payer: Self-pay | Admitting: Podiatry

## 2023-03-25 DIAGNOSIS — M7662 Achilles tendinitis, left leg: Secondary | ICD-10-CM

## 2023-03-25 DIAGNOSIS — B351 Tinea unguium: Secondary | ICD-10-CM | POA: Diagnosis not present

## 2023-03-25 DIAGNOSIS — M7661 Achilles tendinitis, right leg: Secondary | ICD-10-CM | POA: Diagnosis not present

## 2023-03-25 MED ORDER — MELOXICAM 15 MG PO TABS: ORAL_TABLET | ORAL | 1 refills | Status: AC

## 2023-03-25 NOTE — Progress Notes (Signed)
  Subjective:  Patient ID: Kristina Hudson, female    DOB: 1971/07/17,   MRN: [id number]  Chief Complaint  Patient presents with   Follow-up    Pain seems to have subsided in bilateral feet, Patient states that stretches seem to help    52 y.o. female presents for follow-up of bilateral achilles tendonitis. Relates doing almost 100% better. Has been stretching and taking meloxicam and using lifts. Has a few more days left of lamisil relates nail maybe getting some better.  Denies any other pedal complaints. Denies n/v/f/c.   No past medical history on file.  Objective:  Physical Exam: Vascular: DP/PT pulses 2/4 bilateral. CFT <3 seconds. Normal hair growth on digits. No edema.  Skin. No lacerations or abrasions bilateral feet. Left hallux nail thickened and dsicolored. Proximal border appears clear.  Musculoskeletal: MMT 5/5 bilateral lower extremities in DF, PF, Inversion and Eversion. Deceased ROM in DF of ankle joint. Tender to watershed area of achilles tendon bilteral. No pain with PF and DF. No palpable delve. Negative thompsons sign. Mostly on right.   Neurological: Sensation intact to light touch.   Assessment:   1. Achilles tendonitis, bilateral   2. Onychomycosis        Plan:  Patient was evaluated and treated and all questions answered. -Xrays reviewed. Spurring noted to posterior calcaneus bilateral -Discussed Achilles insertional tendonitis and treatment options with patient.  Continue stretching and heel lifts.  -Rx Meloxicam refilled -Discussed if no improvement will consider MRI/PT/EPAT/PRP injections.    -Examined patient -Discussed treatment options for painful dystrophic nails  -Discussed fungal nail treatment options including oral, topical, and laser treatments.  Finish out lamisil  Follow-up as needed.      Louann Sjogren, DPM

## 2023-07-24 DIAGNOSIS — Z0000 Encounter for general adult medical examination without abnormal findings: Secondary | ICD-10-CM | POA: Diagnosis not present

## 2023-07-24 DIAGNOSIS — G2581 Restless legs syndrome: Secondary | ICD-10-CM | POA: Diagnosis not present

## 2023-07-24 DIAGNOSIS — F419 Anxiety disorder, unspecified: Secondary | ICD-10-CM | POA: Diagnosis not present

## 2023-07-24 DIAGNOSIS — R946 Abnormal results of thyroid function studies: Secondary | ICD-10-CM | POA: Diagnosis not present

## 2023-07-24 DIAGNOSIS — Z13220 Encounter for screening for lipoid disorders: Secondary | ICD-10-CM | POA: Diagnosis not present

## 2023-08-18 DIAGNOSIS — H04123 Dry eye syndrome of bilateral lacrimal glands: Secondary | ICD-10-CM | POA: Diagnosis not present

## 2023-08-18 DIAGNOSIS — H40013 Open angle with borderline findings, low risk, bilateral: Secondary | ICD-10-CM | POA: Diagnosis not present

## 2023-08-28 DIAGNOSIS — D649 Anemia, unspecified: Secondary | ICD-10-CM | POA: Diagnosis not present

## 2023-09-02 ENCOUNTER — Other Ambulatory Visit: Payer: Self-pay | Admitting: Family Medicine

## 2023-09-02 DIAGNOSIS — Z0000 Encounter for general adult medical examination without abnormal findings: Secondary | ICD-10-CM

## 2023-09-26 DIAGNOSIS — K219 Gastro-esophageal reflux disease without esophagitis: Secondary | ICD-10-CM | POA: Diagnosis not present

## 2023-09-30 ENCOUNTER — Ambulatory Visit
Admission: RE | Admit: 2023-09-30 | Discharge: 2023-09-30 | Disposition: A | Discharge status: Home / Self Care | Payer: BC Managed Care – PPO | Source: Ambulatory Visit | Attending: Family Medicine

## 2023-09-30 DIAGNOSIS — K219 Gastro-esophageal reflux disease without esophagitis: Secondary | ICD-10-CM | POA: Diagnosis not present

## 2023-09-30 DIAGNOSIS — R490 Dysphonia: Secondary | ICD-10-CM | POA: Diagnosis not present

## 2023-09-30 DIAGNOSIS — Z1231 Encounter for screening mammogram for malignant neoplasm of breast: Secondary | ICD-10-CM | POA: Diagnosis not present

## 2023-09-30 DIAGNOSIS — Z0000 Encounter for general adult medical examination without abnormal findings: Secondary | ICD-10-CM

## 2023-11-06 DIAGNOSIS — K219 Gastro-esophageal reflux disease without esophagitis: Secondary | ICD-10-CM | POA: Diagnosis not present

## 2023-11-06 DIAGNOSIS — K640 First degree hemorrhoids: Secondary | ICD-10-CM | POA: Diagnosis not present

## 2023-11-06 DIAGNOSIS — K2930 Chronic superficial gastritis without bleeding: Secondary | ICD-10-CM | POA: Diagnosis not present

## 2023-11-06 DIAGNOSIS — Z1211 Encounter for screening for malignant neoplasm of colon: Secondary | ICD-10-CM | POA: Diagnosis not present

## 2023-11-06 DIAGNOSIS — D123 Benign neoplasm of transverse colon: Secondary | ICD-10-CM | POA: Diagnosis not present

## 2024-01-26 DIAGNOSIS — F419 Anxiety disorder, unspecified: Secondary | ICD-10-CM | POA: Diagnosis not present

## 2024-01-26 DIAGNOSIS — M2550 Pain in unspecified joint: Secondary | ICD-10-CM | POA: Diagnosis not present

## 2024-01-26 DIAGNOSIS — K219 Gastro-esophageal reflux disease without esophagitis: Secondary | ICD-10-CM | POA: Diagnosis not present

## 2024-01-26 DIAGNOSIS — N951 Menopausal and female climacteric states: Secondary | ICD-10-CM | POA: Diagnosis not present

## 2024-01-26 DIAGNOSIS — G2581 Restless legs syndrome: Secondary | ICD-10-CM | POA: Diagnosis not present

## 2024-02-17 DIAGNOSIS — H40013 Open angle with borderline findings, low risk, bilateral: Secondary | ICD-10-CM | POA: Diagnosis not present

## 2024-02-17 DIAGNOSIS — H04123 Dry eye syndrome of bilateral lacrimal glands: Secondary | ICD-10-CM | POA: Diagnosis not present

## 2024-02-17 DIAGNOSIS — Z83518 Family history of other specified eye disorder: Secondary | ICD-10-CM | POA: Diagnosis not present

## 2024-02-17 DIAGNOSIS — H20041 Secondary noninfectious iridocyclitis, right eye: Secondary | ICD-10-CM | POA: Diagnosis not present

## 2024-08-06 DIAGNOSIS — Z0000 Encounter for general adult medical examination without abnormal findings: Secondary | ICD-10-CM | POA: Diagnosis not present

## 2024-08-06 DIAGNOSIS — Z13220 Encounter for screening for lipoid disorders: Secondary | ICD-10-CM | POA: Diagnosis not present

## 2024-08-11 DIAGNOSIS — G2581 Restless legs syndrome: Secondary | ICD-10-CM | POA: Diagnosis not present

## 2024-08-11 DIAGNOSIS — Z124 Encounter for screening for malignant neoplasm of cervix: Secondary | ICD-10-CM | POA: Diagnosis not present

## 2024-08-11 DIAGNOSIS — Z0000 Encounter for general adult medical examination without abnormal findings: Secondary | ICD-10-CM | POA: Diagnosis not present

## 2024-08-11 DIAGNOSIS — F419 Anxiety disorder, unspecified: Secondary | ICD-10-CM | POA: Diagnosis not present

## 2024-08-11 DIAGNOSIS — Z23 Encounter for immunization: Secondary | ICD-10-CM | POA: Diagnosis not present

## 2024-09-02 ENCOUNTER — Other Ambulatory Visit: Payer: Self-pay | Admitting: Family Medicine

## 2024-09-02 DIAGNOSIS — Z1231 Encounter for screening mammogram for malignant neoplasm of breast: Secondary | ICD-10-CM

## 2024-10-01 ENCOUNTER — Ambulatory Visit
Admission: RE | Admit: 2024-10-01 | Discharge: 2024-10-01 | Disposition: A | Discharge status: Home / Self Care | Source: Ambulatory Visit | Attending: Family Medicine | Admitting: Family Medicine

## 2024-10-01 DIAGNOSIS — Z1231 Encounter for screening mammogram for malignant neoplasm of breast: Secondary | ICD-10-CM | POA: Diagnosis not present

## 2024-10-06 ENCOUNTER — Other Ambulatory Visit: Payer: Self-pay | Admitting: Family Medicine

## 2024-10-06 DIAGNOSIS — R928 Other abnormal and inconclusive findings on diagnostic imaging of breast: Secondary | ICD-10-CM

## 2024-10-18 ENCOUNTER — Ambulatory Visit
Admission: RE | Admit: 2024-10-18 | Discharge: 2024-10-18 | Disposition: A | Discharge status: Home / Self Care | Source: Ambulatory Visit | Attending: Family Medicine | Admitting: Family Medicine

## 2024-10-18 ENCOUNTER — Other Ambulatory Visit: Payer: Self-pay | Admitting: Family Medicine

## 2024-10-18 ENCOUNTER — Ambulatory Visit
Admission: RE | Admit: 2024-10-18 | Discharge: 2024-10-18 | Disposition: A | Source: Ambulatory Visit | Attending: Family Medicine

## 2024-10-18 DIAGNOSIS — R928 Other abnormal and inconclusive findings on diagnostic imaging of breast: Secondary | ICD-10-CM

## 2024-10-18 DIAGNOSIS — N6489 Other specified disorders of breast: Secondary | ICD-10-CM | POA: Diagnosis not present

## 2024-10-18 DIAGNOSIS — N6312 Unspecified lump in the right breast, upper inner quadrant: Secondary | ICD-10-CM

## 2024-10-19 ENCOUNTER — Other Ambulatory Visit

## 2024-10-20 ENCOUNTER — Ambulatory Visit
Admission: RE | Admit: 2024-10-20 | Discharge: 2024-10-20 | Disposition: A | Source: Ambulatory Visit | Attending: Family Medicine

## 2024-10-20 ENCOUNTER — Ambulatory Visit
Admission: RE | Admit: 2024-10-20 | Discharge: 2024-10-20 | Disposition: A | Discharge status: Home / Self Care | Source: Ambulatory Visit | Attending: Family Medicine | Admitting: Family Medicine

## 2024-10-20 DIAGNOSIS — N6312 Unspecified lump in the right breast, upper inner quadrant: Secondary | ICD-10-CM | POA: Diagnosis not present

## 2024-10-20 DIAGNOSIS — R928 Other abnormal and inconclusive findings on diagnostic imaging of breast: Secondary | ICD-10-CM

## 2024-10-20 DIAGNOSIS — C50411 Malignant neoplasm of upper-outer quadrant of right female breast: Secondary | ICD-10-CM | POA: Diagnosis not present

## 2024-10-20 HISTORY — PX: BREAST BIOPSY: SHX20

## 2024-10-22 LAB — SURGICAL PATHOLOGY

## 2024-10-26 ENCOUNTER — Telehealth: Payer: Self-pay | Admitting: *Deleted

## 2024-10-26 NOTE — Telephone Encounter (Signed)
 Spoke to patient to confirm upcoming morning [facility] clinic appointment on 12/10, paperwork will be sent via email ([email or web address])  Gave location and time, also informed patient that the surgeon's office would be calling as well to get information from them similar to the packet that they will be receiving so make sure to do both.  Reminded patient that all providers will be coming to the clinic to see them HERE and if they had any questions to not hesitate to reach back out to myself or their navigators.

## 2024-11-01 ENCOUNTER — Encounter: Payer: Self-pay | Admitting: *Deleted

## 2024-11-01 DIAGNOSIS — Z170 Estrogen receptor positive status [ER+]: Secondary | ICD-10-CM | POA: Insufficient documentation

## 2024-11-02 DIAGNOSIS — L814 Other melanin hyperpigmentation: Secondary | ICD-10-CM | POA: Diagnosis not present

## 2024-11-02 DIAGNOSIS — L821 Other seborrheic keratosis: Secondary | ICD-10-CM | POA: Diagnosis not present

## 2024-11-02 DIAGNOSIS — L578 Other skin changes due to chronic exposure to nonionizing radiation: Secondary | ICD-10-CM | POA: Diagnosis not present

## 2024-11-02 DIAGNOSIS — D1801 Hemangioma of skin and subcutaneous tissue: Secondary | ICD-10-CM | POA: Diagnosis not present

## 2024-11-02 NOTE — Progress Notes (Signed)
 Radiation Oncology         [phone] ________________________________  Initial Outpatient Consultation  Name: Kristina Hudson MRN: [id number]  Date: 11/03/2024  DOB: [birth year]  RR:Yjhozm, Vernell, MD  Vanderbilt Ned, MD   REFERRING PHYSICIAN: Vanderbilt Ned, MD  DIAGNOSIS: No diagnosis found.   Cancer Staging  No matching staging information was found for the patient.  Stage *** Right Breast UIQ, Invasive Ductal Carcinoma, ER+ / PR- / Her2-, Grade 3  CHIEF COMPLAINT: Here to discuss management of right breast cancer  HISTORY OF PRESENT ILLNESS::Kristina Hudson is a 53 y.o. female who presented with a right breast abnormality on the following imaging: bilateral screening mammogram on the date of 10/01/24. No symptoms, if any, were reported at that time. She accordingly underwent a right breast diagnostic mammogram and right breast ultrasound on 10/18/24 which further revealed: a suspicious 1.3 cm mass in the 1 o'clock region of the right breast, located 10 cmfn. There was no evidence of right axillary lymphadenopathy. Physical exam performed at the time of this study also confirmed a palpable mass in the upper inner right breast corresponding to the imaged mass.   Biopsy of the right breast mass on the date of 10/20/24 showed grade 3 invasive ductal carcinoma measuring 0.7 cm in the greatest linear extent of the sample; negative for LVI.  ER status: 50% positive with weak-moderate staining intensity; PR status 0% negative; Proliferation marker Ki67 at 70%; Her2 status negative; Grade 3. No lymph nodes were examined.   ***  PREVIOUS RADIATION THERAPY: No  PAST MEDICAL HISTORY:  has no past medical history on file.    PAST SURGICAL HISTORY: Past Surgical History:  Procedure Laterality Date   BREAST BIOPSY Right 10/20/2024   US  RT BREAST BX W LOC DEV 1ST LESION IMG BX SPEC US  GUIDE 10/20/2024 [facility] M[facility]MOGR[facility]    FAMILY HISTORY: family history is not on file.  SOCIAL HISTORY:     ALLERGIES: Codeine and Shellfish allergy  MEDICATIONS:  Current Outpatient Medications  Medication Sig Dispense Refill   meloxicam  (MOBIC ) 15 MG tablet TAKE 1 TABLET(15 MG) BY MOUTH DAILY 30 tablet 1   terbinafine  (LAMISIL ) 250 MG tablet Take 1 tablet (250 mg total) by mouth daily. 90 tablet 0   No current facility-administered medications for this encounter.    REVIEW OF SYSTEMS: As above in HPI.   PHYSICAL EXAM:  vitals were not taken for this visit.   General: Alert and oriented, in no acute distress HEENT: Head is normocephalic. Extraocular movements are intact.  Heart: Regular in rate and rhythm with no murmurs, rubs, or gallops. Chest: Clear to auscultation bilaterally, with no rhonchi, wheezes, or rales. Abdomen: Soft, nontender, nondistended, with no rigidity or guarding. Extremities: No cyanosis or edema. Skin: No concerning lesions. Musculoskeletal: symmetric strength and muscle tone throughout. Neurologic: Cranial nerves II through XII are grossly intact. No obvious focalities. Speech is fluent. Coordination is intact. Psychiatric: Judgment and insight are intact. Affect is appropriate. Breasts: *** . No other palpable masses appreciated in the breasts or axillae *** .    ECOG = ***  0 - Asymptomatic (Fully active, able to carry on all predisease activities without restriction)  1 - Symptomatic but completely ambulatory (Restricted in physically strenuous activity but ambulatory and able to carry out work of a light or sedentary nature. For example, light housework, office work)  2 - Symptomatic, <50% in bed during the day (Ambulatory and capable of all self care but unable to  carry out any work activities. Up and about more than 50% of waking hours)  3 - Symptomatic, >50% in bed, but not bedbound (Capable of only limited self-care, confined to bed or chair 50% or more of waking hours)  4 - Bedbound (Completely disabled. Cannot carry on any self-care. Totally  confined to bed or chair)  5 - Death   Raylene MM, Creech RH, Tormey DC, et al. [phone]). Toxicity and response criteria of the [facility]. Am. DOROTHA Bridges. Oncol. 5 (6): 649-55   LABORATORY DATA:   CBC No results found for: WBC, RBC, HGB, HCT, PLT, MCV, MCH, MCHC, RDW, LYMPHSABS, MONOABS, EOSABS, BASOSABS  CMP  No results found for: NA, K, CL, CO2, GLUCOSE, BUN, CREATININE, CALCIUM, PROT, ALBUMIN, AST, ALT, ALKPHOS, BILITOT, GFR, EGFR, GFRNONAA    RADIOGRAPHY: US  RT BREAST BX W LOC DEV 1ST LESION IMG BX SPEC US  GUIDE Addendum Date: 10/25/2024 ADDENDUM REPORT: 10/25/2024 11:47 ADDENDUM: PATHOLOGY revealed: 1. Breast, right, needle core biopsy, 10 o'clock 10cmfn (coil clip) - INVASIVE DUCTAL CARCINOMA - OVERALL GRADE: 3 - LYMPHOVASCULAR INVASION: NOT IDENTIFIED - CANCER LENGTH: 0.7 CM / 7 MM - CALCIFICATIONS: NOT IDENTIFIED. Pathology results are CONCORDANT with imaging findings, per Dr. Curtistine Noble. Pathology results and recommendations were discussed with patient via telephone on 10/25/2024 by Jacquline Cooter RN. Patient reported biopsy site doing well with no adverse symptoms, and slight tenderness at the site. Post biopsy care instructions were reviewed, questions were answered and my direct phone number was provided. Patient was instructed to call [facility] for any additional questions or concerns related to biopsy site. RECOMMENDATION: Surgical and oncological consultation. Patient was referred to the [facility] at [facility] with appointment on 11/03/2024. Patient also requested that a referral be sent to [facility] since she is undecided if she wants to go to [facility] or [facility] for her treatment. I reached out to patient's PCP Dr. Vernell Fort and requested she send a referral to them. Pathology results  reported by Jacquline Cooter RN on 10/25/2024. Electronically Signed   By: Curtistine Noble   On: 10/25/2024 11:47   Result Date: 10/25/2024 CLINICAL DATA:  53 year old female presents for a right breast ultrasound-guided biopsy. EXAM: ULTRASOUND GUIDED RIGHT BREAST CORE NEEDLE BIOPSY COMPARISON:  Previous exam(s). PROCEDURE: The procedure was discussed with the patient including benefits and alternatives. We discussed the risks of the procedure, including infection, bleeding, tissue injury and inadequate sampling. Post biopsy clip placement and possible clip migration were also discussed. Informed written consent was given. The usual time-out protocol was performed immediately prior to the procedure. Right breast: The patient was scanned and the area of interest in the 1 o' clock position, 10 cm from the nipple, was localized which correlates with the area of concern seen on prior imaging studies. This area was targeted for ultrasound guided core needle biopsy. After sterile skin prep and 1% lidocaine with and without epinephrine for local anesthesia, a 14 gauge spring-loaded biopsy needle was used under direct ultrasound visualization to obtain several cores of tissue from the mass using a lateral to medial approach. Next, under ultrasound guidance, a coil shaped clip was placed in the sampled mass. There were no immediate post-procedure complications. A follow up 2 view mammogram was performed and dictated separately. IMPRESSION: Ultrasound guided biopsy of the right breast as above. No apparent complications. Electronically Signed: By: Curtistine Noble On: 10/20/2024 11:33  MM CLIP PLACEMENT RIGHT Result Date: 10/20/2024 CLINICAL DATA:  53 year old female status post right breast ultrasound-guided biopsy. EXAM: 3D DIAGNOSTIC RIGHT MAMMOGRAM POST ULTRASOUND BIOPSY COMPARISON:  Previous exam(s). ACR Breast Density Category b: There are scattered areas of fibroglandular density. FINDINGS: 3D Mammographic  images were obtained following ultrasound guided biopsy of a right breast mass at the 1 o'clock position, approximately 10 cm from the nipple. The biopsy marking clip is in expected position at the site of biopsy. IMPRESSION: Appropriate positioning of the coil shaped biopsy marking clip at the site of biopsy in the right breast as above. Final Assessment: Post Procedure Mammograms for Marker Placement Electronically Signed   By: Curtistine Noble   On: 10/20/2024 11:42   MM 3D DIAGNOSTIC MAMMOGRAM UNILATERAL RIGHT BREAST Result Date: 10/18/2024 CLINICAL DATA:  RIGHT breast mass callback EXAM: DIGITAL DIAGNOSTIC UNILATERAL RIGHT MAMMOGRAM WITH TOMOSYNTHESIS AND CAD; ULTRASOUND RIGHT BREAST LIMITED TECHNIQUE: Right digital diagnostic mammography and breast tomosynthesis was performed. The images were evaluated with computer-aided detection. ; Targeted ultrasound examination of the right breast was performed COMPARISON:  Previous exam(s). ACR Breast Density Category c: The breasts are heterogeneously dense, which may obscure small masses. FINDINGS: Spot compression tomosynthesis views of the RIGHT breast demonstrate a persistent irregular mass in the upper inner breast at far posterior depth. On physical exam, there is a firm area in the RIGHT upper inner breast. Patient reports she can now feel this area. Targeted ultrasound was performed of the RIGHT upper inner breast. At 1 o'clock 10 cm from the nipple, there is an irregular hypoechoic mass with irregular margins. This measures 13 x 10 x 13 mm. This corresponds to the site of screening mammographic concern. Targeted ultrasound was performed of the RIGHT axilla. No suspicious axillary lymph nodes are visualized. IMPRESSION: 1. There is a suspicious 13 mm mass at the site of screening mammographic concern. Recommend ultrasound-guided biopsy for definitive characterization. 2. No suspicious RIGHT axillary adenopathy. RECOMMENDATION: RIGHT breast ultrasound-guided  biopsy x1 I have discussed the findings and recommendations with the patient. The biopsy procedure was discussed with the patient and questions were answered. Patient expressed their understanding of the biopsy recommendation. Patient will be scheduled for biopsy at her earliest convenience by the schedulers. Ordering provider will be notified. If applicable, a reminder letter will be sent to the patient regarding the next appointment. BI-RADS CATEGORY  5: Highly suggestive of malignancy. Electronically Signed   By: Corean Salter M.D.   On: 10/18/2024 11:23   US  LIMITED ULTRASOUND INCLUDING AXILLA RIGHT BREAST Result Date: 10/18/2024 CLINICAL DATA:  RIGHT breast mass callback EXAM: DIGITAL DIAGNOSTIC UNILATERAL RIGHT MAMMOGRAM WITH TOMOSYNTHESIS AND CAD; ULTRASOUND RIGHT BREAST LIMITED TECHNIQUE: Right digital diagnostic mammography and breast tomosynthesis was performed. The images were evaluated with computer-aided detection. ; Targeted ultrasound examination of the right breast was performed COMPARISON:  Previous exam(s). ACR Breast Density Category c: The breasts are heterogeneously dense, which may obscure small masses. FINDINGS: Spot compression tomosynthesis views of the RIGHT breast demonstrate a persistent irregular mass in the upper inner breast at far posterior depth. On physical exam, there is a firm area in the RIGHT upper inner breast. Patient reports she can now feel this area. Targeted ultrasound was performed of the RIGHT upper inner breast. At 1 o'clock 10 cm from the nipple, there is an irregular hypoechoic mass with irregular margins. This measures 13 x 10 x 13 mm. This corresponds to the site of screening mammographic concern. Targeted ultrasound was performed of  the RIGHT axilla. No suspicious axillary lymph nodes are visualized. IMPRESSION: 1. There is a suspicious 13 mm mass at the site of screening mammographic concern. Recommend ultrasound-guided biopsy for definitive  characterization. 2. No suspicious RIGHT axillary adenopathy. RECOMMENDATION: RIGHT breast ultrasound-guided biopsy x1 I have discussed the findings and recommendations with the patient. The biopsy procedure was discussed with the patient and questions were answered. Patient expressed their understanding of the biopsy recommendation. Patient will be scheduled for biopsy at her earliest convenience by the schedulers. Ordering provider will be notified. If applicable, a reminder letter will be sent to the patient regarding the next appointment. BI-RADS CATEGORY  5: Highly suggestive of malignancy. Electronically Signed   By: Corean Salter M.D.   On: 10/18/2024 11:23      IMPRESSION/PLAN: ***   It was a pleasure meeting the patient today. We discussed the risks, benefits, and side effects of radiotherapy. I recommend radiotherapy to the *** to reduce her risk of locoregional recurrence by 2/3.  We discussed that radiation would take approximately *** weeks to complete and that I would give the patient a few weeks to heal following surgery before starting treatment planning. *** If chemotherapy were to be given, this would precede radiotherapy. We spoke about acute effects including skin irritation and fatigue as well as much less common late effects including internal organ injury or irritation. We spoke about the latest technology that is used to minimize the risk of late effects for patients undergoing radiotherapy to the breast or chest wall. No guarantees of treatment were given. The patient is enthusiastic about proceeding with treatment. I look forward to participating in the patient's care.  I will await her referral back to me for postoperative follow-up and eventual CT simulation/treatment planning.  On date of service, in total, I spent *** minutes on this encounter. Patient was seen in person.   __________________________________________   Lauraine Golden, MD  This document serves as a record  of services personally performed by Lauraine Golden, MD. It was created on her behalf by Dorthy Fuse, a trained medical scribe. The creation of this record is based on the scribe's personal observations and the provider's statements to them. This document has been checked and approved by the attending provider.

## 2024-11-03 ENCOUNTER — Encounter: Payer: Self-pay | Admitting: *Deleted

## 2024-11-03 ENCOUNTER — Inpatient Hospital Stay

## 2024-11-03 ENCOUNTER — Inpatient Hospital Stay: Admitting: Licensed Clinical Social Worker

## 2024-11-03 ENCOUNTER — Ambulatory Visit: Payer: Self-pay | Admitting: Surgery

## 2024-11-03 ENCOUNTER — Encounter: Payer: Self-pay | Admitting: Physical Therapy

## 2024-11-03 ENCOUNTER — Inpatient Hospital Stay: Attending: Hematology and Oncology | Admitting: Hematology and Oncology

## 2024-11-03 ENCOUNTER — Ambulatory Visit
Admission: RE | Admit: 2024-11-03 | Discharge: 2024-11-03 | Attending: Radiation Oncology | Admitting: Radiation Oncology

## 2024-11-03 ENCOUNTER — Other Ambulatory Visit: Payer: Self-pay

## 2024-11-03 ENCOUNTER — Ambulatory Visit: Admitting: Physical Therapy

## 2024-11-03 VITALS — BP 125/53 | HR 91 | Temp 97.5°F | Resp 16 | Ht 62.99 in | Wt 165.4 lb

## 2024-11-03 DIAGNOSIS — C50211 Malignant neoplasm of upper-inner quadrant of right female breast: Secondary | ICD-10-CM | POA: Insufficient documentation

## 2024-11-03 DIAGNOSIS — R293 Abnormal posture: Secondary | ICD-10-CM | POA: Insufficient documentation

## 2024-11-03 DIAGNOSIS — C50411 Malignant neoplasm of upper-outer quadrant of right female breast: Secondary | ICD-10-CM

## 2024-11-03 DIAGNOSIS — Z170 Estrogen receptor positive status [ER+]: Secondary | ICD-10-CM | POA: Insufficient documentation

## 2024-11-03 DIAGNOSIS — C50911 Malignant neoplasm of unspecified site of right female breast: Secondary | ICD-10-CM

## 2024-11-03 LAB — CBC WITH DIFFERENTIAL (CANCER CENTER ONLY)
Abs Immature Granulocytes: 0.01 K/uL (ref 0.00–0.07)
Basophils Absolute: 0 K/uL (ref 0.0–0.1)
Basophils Relative: 1 %
Eosinophils Absolute: 0.1 K/uL (ref 0.0–0.5)
Eosinophils Relative: 1 %
HCT: 39.9 % (ref 36.0–46.0)
Hemoglobin: 13.2 g/dL (ref 12.0–15.0)
Immature Granulocytes: 0 %
Lymphocytes Relative: 26 %
Lymphs Abs: 1.6 K/uL (ref 0.7–4.0)
MCH: 29.1 pg (ref 26.0–34.0)
MCHC: 33.1 g/dL (ref 30.0–36.0)
MCV: 88.1 fL (ref 80.0–100.0)
Monocytes Absolute: 0.4 K/uL (ref 0.1–1.0)
Monocytes Relative: 7 %
Neutro Abs: 4 K/uL (ref 1.7–7.7)
Neutrophils Relative %: 65 %
Platelet Count: 255 K/uL (ref 150–400)
RBC: 4.53 MIL/uL (ref 3.87–5.11)
RDW: 12.8 % (ref 11.5–15.5)
WBC Count: 6.1 K/uL (ref 4.0–10.5)
nRBC: 0 % (ref 0.0–0.2)

## 2024-11-03 LAB — CMP (CANCER CENTER ONLY)
ALT: 27 U/L (ref 0–44)
AST: 22 U/L (ref 15–41)
Albumin: 4.7 g/dL (ref 3.5–5.0)
Alkaline Phosphatase: 89 U/L (ref 38–126)
Anion gap: 10 (ref 5–15)
BUN: 13 mg/dL (ref 6–20)
CO2: 28 mmol/L (ref 22–32)
Calcium: 9.7 mg/dL (ref 8.9–10.3)
Chloride: 103 mmol/L (ref 98–111)
Creatinine: 0.86 mg/dL (ref 0.44–1.00)
GFR, Estimated: >60 mL/min (ref >60)
Glucose, Bld: 90 mg/dL (ref 70–99)
Potassium: 4 mmol/L (ref 3.5–5.1)
Sodium: 141 mmol/L (ref 135–145)
Total Bilirubin: 0.3 mg/dL (ref 0.0–1.2)
Total Protein: 8 g/dL (ref 6.5–8.1)

## 2024-11-03 LAB — GENETIC SCREENING ORDER

## 2024-11-03 LAB — RESEARCH LABS

## 2024-11-03 NOTE — Progress Notes (Signed)
 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 Postmenopausal.   - Perform lumpectomy and repeat prognostic testing for ER, PR, and HER2. - Conduct Oncotype testing if ER positive to determine chemotherapy necessity. - Administer chemotherapy every 21 days for four cycles if Oncotype score >26. - Proceed with chemotherapy if triple negative. - Consider cold cap to reduce hair loss during chemotherapy. - Recommend estrogen blocker post-radiation if ER positivity confirmed. - She is in agreement with the plan.  HISTORY OF PRESENTING ILLNESS:  Kristina Hudson 53 y.o. female is here because of new diagnosis of right breast cancer  Discussed the use of AI scribe software for clinical note transcription with the patient, who gave verbal consent to proceed.  History of Present Illness Kristina Hudson is a 53 year old female with invasive ductal carcinoma of the right breast who presents for oncology consultation regarding treatment options.  The tumor is high grade (grade 3) and is weakly estrogen receptor (ER) positive, with 50% of the tumor staining for ER, PR neg, Her 2 non amplified and Ki 67 of 70%  She is post menopausal, having gone through menopause about a year and a half ago. She has not used hormone replacement therapy and  experienced significant nausea with hormone-based birth control in the past, used them last in 1998.   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 She has flexibility in her work schedule and is considering continuing to work during potential chemotherapy treatment. She consumes alcohol, primarily wine, may be 1-2 drinks a week.  All other systems were reviewed with the patient and are negative.  MEDICAL HISTORY:  Past Medical History:  Diagnosis Date   Gallstones     SURGICAL HISTORY: Past Surgical History:  Procedure Laterality Date   BREAST BIOPSY Right 10/20/2024   US  RT BREAST BX W LOC DEV 1ST LESION IMG BX SPEC US  GUIDE 10/20/2024 [facility] MAMMOGRAPHY   CHOLECYSTECTOMY     GANGLION CYST EXCISION      SOCIAL HISTORY: Social History   Socioeconomic History   Marital status: Married    Spouse name: Not on file   Number of children: Not on file   Years of education: Not on file   Highest education level: Not on file  Occupational History   Not on file  Tobacco Use   Smoking status: Never   Smokeless tobacco: Not on file  Substance and Sexual Activity   Alcohol use: Yes    Comment: 0-2/week   Drug use: Never   Sexual activity: Not on file  Other Topics Concern   Not on file  Social History Narrative   Not on file   Social Drivers of Health   Financial Resource Strain: Not on file  Food Insecurity: No Food Insecurity (11/03/2024)   Hunger Vital Sign    Worried About Running Out of  Food in the Last Year: Never true    Ran Out of Food in the Last Year: Never true  Transportation Needs: No Transportation Needs (11/03/2024)   PRAPARE - Administrator, Civil Service (Medical): No    Lack of Transportation (Non-Medical): No  Physical Activity: Not on file  Stress: Not on file  Social Connections: Not on file  Intimate Partner Violence: Not At Risk (11/03/2024)   Humiliation, Afraid,  Rape, and Kick questionnaire    Fear of Current or Ex-Partner: No    Emotionally Abused: No    Physically Abused: No    Sexually Abused: No    FAMILY HISTORY: Family History  Problem Relation Age of Onset   Lung cancer Paternal Grandfather    Breast cancer Neg Hx     ALLERGIES:  is allergic to codeine and shellfish allergy.  MEDICATIONS:  Current Outpatient Medications  Medication Sig Dispense Refill   meloxicam  (MOBIC ) 15 MG tablet TAKE 1 TABLET(15 MG) BY MOUTH DAILY 30 tablet 1   terbinafine  (LAMISIL ) 250 MG tablet Take 1 tablet (250 mg total) by mouth daily. 90 tablet 0   No current facility-administered medications for this visit.     PHYSICAL EXAMINATION: ECOG PERFORMANCE STATUS: 0 - Asymptomatic  Vitals:   11/03/24 0902  BP: (!) 125/53  Pulse: 91  Resp: 16  Temp: (!) 97.5 F (36.4 C)  SpO2: 95%   Filed Weights   11/03/24 0902  Weight: 165 lb 6.4 oz (75 kg)    GENERAL:alert, no distress and comfortable Palpable right breast mass at 2 o clock measuring just over a cm No regional adenopathy  LABORATORY DATA:  I have reviewed the data as listed No results found for: WBC, HGB, HCT, MCV, PLT   Chemistry   No results found for: NA, K, CL, CO2, BUN, CREATININE, GLU No results found for: CALCIUM, ALKPHOS, AST, ALT, BILITOT     RADIOGRAPHIC STUDIES: I have personally reviewed the radiological images as listed and agreed with the findings in the report. US  RT BREAST BX W LOC DEV 1ST LESION IMG BX SPEC US  GUIDE Addendum Date: 10/25/2024 ADDENDUM REPORT: 10/25/2024 11:47 ADDENDUM: PATHOLOGY revealed: 1. Breast, right, needle core biopsy, 10 o'clock 10cmfn (coil clip) - INVASIVE DUCTAL CARCINOMA - OVERALL GRADE: 3 - LYMPHOVASCULAR INVASION: NOT IDENTIFIED - CANCER LENGTH: 0.7 CM / 7 MM - CALCIFICATIONS: NOT IDENTIFIED. Pathology results are CONCORDANT with imaging findings, per Dr. Curtistine Noble. Pathology results and  recommendations were discussed with patient via telephone on 10/25/2024 by Jacquline Cooter RN. Patient reported biopsy site doing well with no adverse symptoms, and slight tenderness at the site. Post biopsy care instructions were reviewed, questions were answered and my direct phone number was provided. Patient was instructed to call [facility] for any additional questions or concerns related to biopsy site. RECOMMENDATION: Surgical and oncological consultation. Patient was referred to the [facility] at [facility] with appointment on 11/03/2024. Patient also requested that a referral be sent to [facility] since she is undecided if she wants to go to [facility] or [facility] for her treatment. I reached out to patient's PCP Dr. Vernell Fort and requested she send a referral to them. Pathology results reported by Jacquline Cooter RN on 10/25/2024. Electronically Signed   By: Curtistine Noble   On: 10/25/2024 11:47   Result Date: 10/25/2024 CLINICAL DATA:  53 year old female presents for a right breast  ultrasound-guided biopsy. EXAM: ULTRASOUND GUIDED RIGHT BREAST CORE NEEDLE BIOPSY COMPARISON:  Previous exam(s). PROCEDURE: The procedure was discussed with the patient including benefits and alternatives. We discussed the risks of the procedure, including infection, bleeding, tissue injury and inadequate sampling. Post biopsy clip placement and possible clip migration were also discussed. Informed written consent was given. The usual time-out protocol was performed immediately prior to the procedure. Right breast: The patient was scanned and the area of interest in the 1 o' clock position, 10 cm from the nipple, was localized which correlates with the area of concern seen on prior imaging studies. This area was targeted for ultrasound guided core needle biopsy. After sterile skin prep and 1% lidocaine with and  without epinephrine for local anesthesia, a 14 gauge spring-loaded biopsy needle was used under direct ultrasound visualization to obtain several cores of tissue from the mass using a lateral to medial approach. Next, under ultrasound guidance, a coil shaped clip was placed in the sampled mass. There were no immediate post-procedure complications. A follow up 2 view mammogram was performed and dictated separately. IMPRESSION: Ultrasound guided biopsy of the right breast as above. No apparent complications. Electronically Signed: By: Curtistine Noble On: 10/20/2024 11:33   MM CLIP PLACEMENT RIGHT Result Date: 10/20/2024 CLINICAL DATA:  53 year old female status post right breast ultrasound-guided biopsy. EXAM: 3D DIAGNOSTIC RIGHT MAMMOGRAM POST ULTRASOUND BIOPSY COMPARISON:  Previous exam(s). ACR Breast Density Category b: There are scattered areas of fibroglandular density. FINDINGS: 3D Mammographic images were obtained following ultrasound guided biopsy of a right breast mass at the 1 o'clock position, approximately 10 cm from the nipple. The biopsy marking clip is in expected position at the site of biopsy. IMPRESSION: Appropriate positioning of the coil shaped biopsy marking clip at the site of biopsy in the right breast as above. Final Assessment: Post Procedure Mammograms for Marker Placement Electronically Signed   By: Curtistine Noble   On: 10/20/2024 11:42   MM 3D DIAGNOSTIC MAMMOGRAM UNILATERAL RIGHT BREAST Result Date: 10/18/2024 CLINICAL DATA:  RIGHT breast mass callback EXAM: DIGITAL DIAGNOSTIC UNILATERAL RIGHT MAMMOGRAM WITH TOMOSYNTHESIS AND CAD; ULTRASOUND RIGHT BREAST LIMITED TECHNIQUE: Right digital diagnostic mammography and breast tomosynthesis was performed. The images were evaluated with computer-aided detection. ; Targeted ultrasound examination of the right breast was performed COMPARISON:  Previous exam(s). ACR Breast Density Category c: The breasts are heterogeneously dense, which  may obscure small masses. FINDINGS: Spot compression tomosynthesis views of the RIGHT breast demonstrate a persistent irregular mass in the upper inner breast at far posterior depth. On physical exam, there is a firm area in the RIGHT upper inner breast. Patient reports she can now feel this area. Targeted ultrasound was performed of the RIGHT upper inner breast. At 1 o'clock 10 cm from the nipple, there is an irregular hypoechoic mass with irregular margins. This measures 13 x 10 x 13 mm. This corresponds to the site of screening mammographic concern. Targeted ultrasound was performed of the RIGHT axilla. No suspicious axillary lymph nodes are visualized. IMPRESSION: 1. There is a suspicious 13 mm mass at the site of screening mammographic concern. Recommend ultrasound-guided biopsy for definitive characterization. 2. No suspicious RIGHT axillary adenopathy. RECOMMENDATION: RIGHT breast ultrasound-guided biopsy x1 I have discussed the findings and recommendations with the patient. The biopsy procedure was discussed with the patient and questions were answered. Patient expressed their understanding of the biopsy recommendation. Patient will be scheduled for biopsy at her earliest convenience by the schedulers. Ordering provider will be notified.  If applicable, a reminder letter will be sent to the patient regarding the next appointment. BI-RADS CATEGORY  5: Highly suggestive of malignancy. Electronically Signed   By: Corean Salter M.D.   On: 10/18/2024 11:23   US  LIMITED ULTRASOUND INCLUDING AXILLA RIGHT BREAST Result Date: 10/18/2024 CLINICAL DATA:  RIGHT breast mass callback EXAM: DIGITAL DIAGNOSTIC UNILATERAL RIGHT MAMMOGRAM WITH TOMOSYNTHESIS AND CAD; ULTRASOUND RIGHT BREAST LIMITED TECHNIQUE: Right digital diagnostic mammography and breast tomosynthesis was performed. The images were evaluated with computer-aided detection. ; Targeted ultrasound examination of the right breast was performed COMPARISON:   Previous exam(s). ACR Breast Density Category c: The breasts are heterogeneously dense, which may obscure small masses. FINDINGS: Spot compression tomosynthesis views of the RIGHT breast demonstrate a persistent irregular mass in the upper inner breast at far posterior depth. On physical exam, there is a firm area in the RIGHT upper inner breast. Patient reports she can now feel this area. Targeted ultrasound was performed of the RIGHT upper inner breast. At 1 o'clock 10 cm from the nipple, there is an irregular hypoechoic mass with irregular margins. This measures 13 x 10 x 13 mm. This corresponds to the site of screening mammographic concern. Targeted ultrasound was performed of the RIGHT axilla. No suspicious axillary lymph nodes are visualized. IMPRESSION: 1. There is a suspicious 13 mm mass at the site of screening mammographic concern. Recommend ultrasound-guided biopsy for definitive characterization. 2. No suspicious RIGHT axillary adenopathy. RECOMMENDATION: RIGHT breast ultrasound-guided biopsy x1 I have discussed the findings and recommendations with the patient. The biopsy procedure was discussed with the patient and questions were answered. Patient expressed their understanding of the biopsy recommendation. Patient will be scheduled for biopsy at her earliest convenience by the schedulers. Ordering provider will be notified. If applicable, a reminder letter will be sent to the patient regarding the next appointment. BI-RADS CATEGORY  5: Highly suggestive of malignancy. Electronically Signed   By: Corean Salter M.D.   On: 10/18/2024 11:23    All questions were answered. The patient knows to call the clinic with any problems, questions or concerns. I spent 45 minutes in the care of this patient including H and P, review of records, counseling and coordination of care.     Amber Stalls, MD 11/03/2024 11:26 AM

## 2024-11-03 NOTE — Progress Notes (Signed)
 [facility] Clinical Social Work  Initial Assessment   Kristina Hudson is a 53 y.o. year old female accompanied by spouse, Acupuncturist. Clinical Social Work was referred by [facility] for assessment of psychosocial needs.   SDOH (Social Determinants of Health) assessments performed: Yes SDOH Interventions    Flowsheet Row Clinical Support from 11/03/2024 in [facility] - A Dept Of [place]  SDOH Interventions   Food Insecurity Interventions Intervention Not Indicated  Housing Interventions Intervention Not Indicated  Transportation Interventions Intervention Not Indicated  Utilities Interventions Intervention Not Indicated    SDOH Screenings   Food Insecurity: No Food Insecurity (11/03/2024)  Housing: Low Risk  (11/03/2024)  Recent Concern: Housing - High Risk (10/29/2024)  Transportation Needs: No Transportation Needs (11/03/2024)  Utilities: Not At Risk (11/03/2024)  Depression (PHQ2-9): Low Risk  (11/03/2024)  Tobacco Use: Low Risk  (11/03/2024)   Received from [facility]    PHQ 2/9:    11/03/2024   10:48 AM  Depression screen PHQ 2/9  Decreased Interest 0  Down, Depressed, Hopeless 0  PHQ - 2 Score 0     Distress Screen completed: No     No data to display            Family/Social Information:  Housing Arrangement: patient lives with husband, Acupuncturist. 2 adult daughters, one in [place] ([place]) and one in [place] ([place]) Family members/support persons in your life? 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  Income source: Employment and husband's employment field seismologist) Financial concerns: No Type of concern: None Food access concerns: no Religious or spiritual practice: Yes-belongs to a church. Husband is a Emergency Planning/management Officer directives: Yes-not on file Services Currently in place:  [employer]  Coping/ Adjustment to diagnosis: Patient  understands treatment plan and what happens next? yes, feels confident in moving forward with the plan from medical team today (lump w SLN, oncotype, XRT, AI) Concerns about diagnosis and/or treatment: I'm not especially worried about anything Patient reported stressors: Adjusting to my illness Patient enjoys time with family/ friends and yoga, puzzles, pinball Current coping skills/ strengths: Ability for insight , Active sense of humor , Capable of independent living , Communication skills , Motivation for treatment/growth , Religious Affiliation , Special hobby/interest , and Supportive family/friends     SUMMARY: Current SDOH Barriers:  No major barriers identified today  Clinical Social Work Clinical Goal(s):  No clinical social work goals at this time  Interventions: Discussed common feeling and emotions when being diagnosed with cancer, and the importance of support during treatment Informed patient of the support team roles and support services at [facility] Provided CSW contact information and encouraged patient to call with any questions or concerns   Follow Up Plan: Patient will contact CSW with any support or resource needs Patient verbalizes understanding of plan: Yes    Hulan Szumski E Voris Tigert, LCSW Clinical Social Worker [facility]

## 2024-11-03 NOTE — Research (Signed)
 [employer] 2021-05 - Specimen Collection Study to Evaluate Biomarkers in Subjects with Cancer     Patient Kristina Hudson was identified by Dr. Loretha as a potential candidate for the above listed study.  This Clinical Research Coordinator met with Kristina Hudson, [id number], on 11/03/24 in a manner and location that ensures patient privacy to discuss participation in the above listed research study.  Patient is Accompanied by husband.  A copy of the informed consent document with embedded HIPAA language was provided to the patient.  Patient reads, speaks, and understands English.    Patient was provided with the business card of this Coordinator and encouraged to contact the research team with any questions.  Patient was provided the option of taking informed consent documents home to review and was encouraged to review at their convenience with their support network, including other care providers. Patient is comfortable with making a decision regarding study participation today.  As outlined in the informed consent form, this Coordinator and Kristina Hudson discussed the purpose of the research study, the investigational nature of the study, study procedures and requirements for study participation, potential risks and benefits of study participation, as well as alternatives to participation. This study is not double blinded. The patient understands participation is voluntary and they may withdraw from study participation at any time.  This study does not involve randomization.  This study does not involve an investigational drug or device. This study does not involve a placebo. Patient understands enrollment is pending full eligibility review.   Confidentiality and how the patient's information will be used as part of study participation were discussed.  Patient was informed there is reimbursement provided for their time and effort spent on trial participation.  The patient is encouraged to discuss  research study participation with their insurance provider to determine what costs they may incur as part of study participation, including research related injury.    All questions were answered to patient's satisfaction.  The informed consent with embedded HIPAA language was reviewed page by page.  The patient's mental and emotional status is appropriate to provide informed consent, and the patient verbalizes an understanding of study participation.  Patient has agreed to participate in the above listed research study and has voluntarily signed the informed consent 27 Jul 2024 version with embedded HIPAA language, version   27 Jul 2024 on 11/03/24 at 12 PM.  The patient was provided with a copy of the signed informed consent form with embedded HIPAA language for their reference.  No study specific procedures were obtained prior to the signing of the informed consent document.  Approximately 15 minutes were spent with the patient reviewing the informed consent documents.  Patient was not requested to complete a Release of Information form.   Eligibility: Eligibility criteria reviewed with patient. This nurse/coordinator has reviewed this patient's inclusion and exclusion criteria and confirmed patient is eligible for study participation. Eligibility confirmed by treating investigator, Dr. Loretha , who also agrees that patient should proceed with enrollment.  Patient will continue with enrollment.  Medical History: This RN/Coordinator reviewed the medical history as reported in the patient's medical record with the participant.  In addition, the participant was asked to report any new medical conditions not previously recorded on the medical history form.   Was the current medical history form correct?   Yes Are there any new medical conditions to report?  No   Based on the review of the medical chart and the patient's responses,  all reportable medical history events will be entered for study reporting  purposes.     Data Collection: Patient was interviewed to collect the following information.  Does participant have a history of: High Blood Pressure   No  Has participant been diagnosed with: Coronary Artery Disease                No Myocardial Infarction                       No Congestive Heart Failure               No Peripheral Vascular Disease          No Cerebrovascular Disease              No Chronic Pulmonary Disease             No COPD (incl Emphysema,Chronic Bronchitis)    No Lupus                               No Rheumatoid Arthritis         No Rheumatoid Disease         No  Does participant have a history of: Diabetes                  No          Has participant been diagnosed with: Dementia                         No Hemiplegia or Paraplegia No Barrett's Esophagus       No Gastric Ulcer                 No Peptic Ulcer Disease      No Mild Liver Disease           No Moderate or Severe Liver Disease  No Liver Cirrhosis                                 No Helicobacter Pylori (H. Pylori)          No Pancreatitis                                       No Renal Disease                                No Chronic Kidney Disease (CKD)   No   Ulcerative Colitis     No Crohn's Disease    No Colorectal Polyps   No Lynch Syndrome    No Hepatitis B or C     No   Is the participant currently taking a magnesium supplement?   Yes If yes, dose and frequency? 3x per wk 500 mg Indication? Restless legs Start date? June 1st 2025  Does the participant have a personal history of cancer (greater than 5 years ago)?  No  Does the participant have a family history of cancer in 1st or 2nd degree relatives? No   Does the participant have history of alcohol consumption? Yes   If yes, current or former? current Number of years? Since 53 yo to current  Drinks per week? 2  Does the participant have a history of cigarette, cigar, pipe, or chewing tobacco use?  No   Blood  Collection: Research blood obtained by fresh venipuncture. Patient tolerated well without any adverse events.  Gift Card: $50 gift card given to patient by Clinical Research Specialist Hobert Decent for her participation in this study.      Patient was thanked for their participation in this study.

## 2024-11-03 NOTE — Research (Signed)
 [employer] 2021-05 - Specimen Collection Study to Evaluate Biomarkers in Subjects with Cancer   This Nurse has reviewed this patient's inclusion and exclusion criteria as a second review and confirms Kristina Hudson is eligible for study participation.  Patient may continue with enrollment.  Cherylyn Hoard, BSN, RN, [employer] Research Nurse II [phone] 11/03/2024 11:41 AM

## 2024-11-03 NOTE — Therapy (Signed)
 OUTPATIENT PHYSICAL THERAPY BREAST CANCER BASELINE EVALUATION   Patient Name: Kristina Hudson MRN: [id number] DOB:[birth year], 53 y.o., female Today's Date: 11/03/2024  END OF SESSION:  PT End of Session - 11/03/24 1108     Visit Number 1    Number of Visits 2    Date for Recertification  12/29/24    PT Start Time 0954    PT Stop Time 1021    PT Time Calculation (min) 27 min    Activity Tolerance Patient tolerated treatment well    Behavior During Therapy [facility] for tasks assessed/performed          Past Medical History:  Diagnosis Date   Gallstones    Past Surgical History:  Procedure Laterality Date   BREAST BIOPSY Right 10/20/2024   US  RT BREAST BX W LOC DEV 1ST LESION IMG BX SPEC US  GUIDE 10/20/2024 [facility] MAMMOGRAPHY   CHOLECYSTECTOMY     GANGLION CYST EXCISION     Patient Active Problem List   Diagnosis Date Noted   Malignant neoplasm of upper-outer quadrant of right breast in female, estrogen receptor positive (HCC) 11/01/2024    REFERRING PROVIDER: Dr. Debby Shipper  REFERRING DIAG: Right breast cancer  THERAPY DIAG:  Malignant neoplasm of upper-outer quadrant of right breast in female, estrogen receptor positive (HCC)  Abnormal posture  Rationale for Evaluation and Treatment: Rehabilitation  ONSET DATE: 10/01/2024  SUBJECTIVE:                                                                                                                                                                                           SUBJECTIVE STATEMENT: Patient reports she is here today to be seen by her medical team for her newly diagnosed right breast cancer.   PERTINENT HISTORY:  Patient was diagnosed on 10/01/2024 with right grade 3 invasive ductal carcinoma breast cancer. It measures 1.3 cm and is located in the upper outer quadrant. It is weakly ER positive, PR and HER2 negative with a Ki67 of 70%.   PATIENT GOALS:   reduce lymphedema risk and learn post op HEP.    PAIN:  Are you having pain? No  PRECAUTIONS: Active CA   RED FLAGS: None   HAND DOMINANCE: right  WEIGHT BEARING RESTRICTIONS: No  FALLS:  Has patient fallen in last 6 months? No  LIVING ENVIRONMENT: Patient lives with: her husband Lives in: House/apartment Has following equipment at home: None  OCCUPATION: Warehouse manager working with peds  LEISURE: She does yoga once a week  PRIOR LEVEL OF FUNCTION: Independent   OBJECTIVE: Note: Objective measures were completed at Evaluation unless otherwise noted.  COGNITION: Overall cognitive status: Within functional limits for tasks assessed    POSTURE:  Forward head and rounded shoulders posture  UPPER EXTREMITY AROM/PROM:  A/PROM RIGHT   eval   Shoulder extension 39  Shoulder flexion 157  Shoulder abduction 171  Shoulder internal rotation 70  Shoulder external rotation 90    (Blank rows = not tested)  A/PROM LEFT   eval  Shoulder extension 45  Shoulder flexion 154  Shoulder abduction 173  Shoulder internal rotation 63  Shoulder external rotation 90    (Blank rows = not tested)  CERVICAL AROM: All within normal limits  UPPER EXTREMITY STRENGTH: WNL  LYMPHEDEMA ASSESSMENTS (in cm):   LANDMARK RIGHT   eval  10 cm proximal to olecranon process from proximal aspect of olecranon 31  Olecranon process 25.9  10 cm proximal to ulnar styloid process from proximal aspect of styloid process 23.5  Just distal to ulnar styloid process 16.5  Across hand at thumb web space 19.8  At base of 2nd digit 6  (Blank rows = not tested)  LANDMARK LEFT   eval  10 cm proximal to olecranon process from proximal aspect of olecranon 30.8   Olecranon process 25.8  10 cm proximal to ulnar styloid process from proximal aspect of styloid process 23.2  Just distal to ulnar styloid process 16.7  Across hand at thumb web space 19.4  At base of 2nd digit 6.3  (Blank rows = not tested)  L-DEX LYMPHEDEMA  SCREENING:  The patient was assessed using the L-Dex machine today to produce a lymphedema index baseline score. The patient will be reassessed on a regular basis (typically every 3 months) to obtain new L-Dex scores. If the score is > 6.5 points away from his/her baseline score indicating onset of subclinical lymphedema, it will be recommended to wear a compression garment for 4 weeks, 12 hours per day and then be reassessed. If the score continues to be > 6.5 points from baseline at reassessment, we will initiate lymphedema treatment. Assessing in this manner has a 95% rate of preventing clinically significant lymphedema.   L-DEX FLOWSHEETS - 11/03/24 1100       L-DEX LYMPHEDEMA SCREENING   Measurement Type Unilateral    L-DEX MEASUREMENT EXTREMITY Upper Extremity    POSITION  Standing    DOMINANT SIDE Right    At Risk Side Right    BASELINE SCORE (UNILATERAL) -0.8          QUICK DASH SURVEY:  Junie Palin - 11/03/24 0001     Open a tight or new jar No difficulty    Do heavy household chores (wash walls, wash floors) No difficulty    Carry a shopping bag or briefcase No difficulty    Wash your back No difficulty    Use a knife to cut food No difficulty    Recreational activities in which you take some force or impact through your arm, shoulder, or hand (golf, hammering, tennis) No difficulty    During the past week, to what extent has your arm, shoulder or hand problem interfered with your normal social activities with family, friends, neighbors, or groups? Not at all    During the past week, to what extent has your arm, shoulder or hand problem limited your work or other regular daily activities Not at all    Arm, shoulder, or hand pain. None    Tingling (pins and needles) in your arm, shoulder, or hand None    Difficulty Sleeping No  difficulty    DASH Score 0 %           PATIENT EDUCATION:  Education details: Time spent educating patient on aspects of self-care to maximize  post op recovery. Patient was educated on where and how to get a post op compression bra to use to reduce post op edema. Patient was also educated on the use of SOZO screenings and surveillance principles for early identification of lymphedema onset. She was instructed to use the post op pillow in the axilla for pressure and pain relief. Patient educated on lymphedema risk reduction and post op shoulder/posture HEP. Person educated: Patient Education method: Explanation, Demonstration, Handout Education comprehension: Patient verbalized understanding and returned demonstration  HOME EXERCISE PROGRAM: Patient was instructed today in a home exercise program today for post op shoulder range of motion. These included active assist shoulder flexion in sitting, scapular retraction, wall walking with shoulder abduction, and hands behind head external rotation.  She was encouraged to do these twice a day, holding 3 seconds and repeating 5 times when permitted by her physician.   ASSESSMENT:  CLINICAL IMPRESSION: Patient was diagnosed on 10/01/2024 with right grade 3 invasive ductal carcinoma breast cancer. It measures 1.3 cm and is located in the upper outer quadrant. It is weakly ER positive, PR and HER2 negative with a Ki67 of 70%. Her multidisciplinary medical team met prior to her assessments to determine a recommended treatment plan. She is planning to have a right lumpectomy and sentinel node biopsy followed by possible chemotherapy and possible anti-estrogen therapy. She will benefit from a post op PT reassessment to determine needs and from L-Dex screens every 3 months for 2 years to detect subclinical lymphedema.  Pt will benefit from skilled therapeutic intervention to improve on the following deficits: Decreased knowledge of precautions, impaired UE functional use, pain, decreased ROM, postural dysfunction.   PT treatment/interventions: ADL/self-care home management, pt/family education,  therapeutic exercise  REHAB POTENTIAL: Excellent  CLINICAL DECISION MAKING: Stable/uncomplicated  EVALUATION COMPLEXITY: Low   GOALS: Goals reviewed with patient? YES  LONG TERM GOALS: (STG=LTG)    Name Target Date Goal status  1 Pt will be able to verbalize understanding of pertinent lymphedema risk reduction practices relevant to her dx specifically related to skin care.  Baseline:  No knowledge 11/03/2024 Achieved at eval  2 Pt will be able to return demo and/or verbalize understanding of the post op HEP related to regaining shoulder ROM. Baseline:  No knowledge 11/03/2024 Achieved at eval  3 Pt will be able to verbalize understanding of the importance of viewing the post op After Breast CA Class video for further lymphedema risk reduction education and therapeutic exercise.  Baseline:  No knowledge 11/03/2024 Achieved at eval  4 Pt will demo she has regained full shoulder ROM and function post operatively compared to baselines.  Baseline: See objective measurements taken today. 12/29/2024     PLAN:  PT FREQUENCY/DURATION: EVAL and 1 follow up appointment.   PLAN FOR NEXT SESSION: will reassess 3-4 weeks post op to determine needs.   Patient will follow up at outpatient cancer rehab 3-4 weeks following surgery.  If the patient requires physical therapy at that time, a specific plan will be dictated and sent to the referring physician for approval. The patient was educated today on appropriate basic range of motion exercises to begin post operatively and the importance of viewing the After Breast Cancer class video following surgery.  Patient was educated today on lymphedema risk reduction practices  as it pertains to recommendations that will benefit the patient immediately following surgery.  She verbalized good understanding.    Physical Therapy Information for After Breast Cancer Surgery/Treatment:  Lymphedema is a swelling condition that you may be at risk for in your arm if  you have lymph nodes removed from the armpit area.  After a sentinel node biopsy, the risk is approximately 5-9% and is higher after an axillary node dissection.  There is treatment available for this condition and it is not life-threatening.  Contact your physician or physical therapist with concerns. You may begin the 4 shoulder/posture exercises (see additional sheet) when permitted by your physician (typically a week after surgery).  If you have drains, you may need to wait until those are removed before beginning range of motion exercises.  A general recommendation is to not lift your arms above shoulder height until drains are removed.  These exercises should be done to your tolerance and gently.  This is not a no pain/no gain type of recovery so listen to your body and stretch into the range of motion that you can tolerate, stopping if you have pain.  If you are having immediate reconstruction, ask your plastic surgeon about doing exercises as he or she may want you to wait. We encourage you to view the After Breast Cancer class video following surgery.  You will learn information related to lymphedema risk, prevention and treatment and additional exercises to regain mobility following surgery.   While undergoing any medical procedure or treatment, try to avoid blood pressure being taken or needle sticks from occurring on the arm on the side of cancer.   This recommendation begins after surgery and continues for the rest of your life.  This may help reduce your risk of getting lymphedema (swelling in your arm). An excellent resource for those seeking information on lymphedema is the National Lymphedema Network's web site. It can be accessed at www.lymphnet.org If you notice swelling in your hand, arm or breast at any time following surgery (even if it is many years from now), please contact your doctor or physical therapist to discuss this.  Lymphedema can be treated at any time but it is easier for  you if it is treated early on.  If you feel like your shoulder motion is not returning to normal in a reasonable amount of time, please contact your surgeon or physical therapist.  [facility] [phone]. [address], [place] [address]  [facility] After Breast Cancer Class  After Breast Cancer Class is a specially designed exercise class video to assist you in a safe recover after having breast cancer surgery.  In this video you will learn how to get back to full function whether your drains were just removed or if you had surgery a month ago. The video can be viewed on this page: [email or web address]-meyer.org/ or on YouTube here: [email or web address].  Class Goals  Understand specific stretches to improve the flexibility of you chest and shoulder. Learn ways to safely strengthen your upper body and improve your posture. Understand the warning signs of infection and why you may be at risk for an arm infection. Learn about Lymphedema and prevention.  ** You do not need to view this video until after surgery.  Drains should be removed to participate in the recommended exercises on the video.  Patient was instructed today in a home exercise program today for post op shoulder range of motion. These included active  assist shoulder flexion in sitting, scapular retraction, wall walking with shoulder abduction, and hands behind head external rotation.  She was encouraged to do these twice a day, holding 3 seconds and repeating 5 times when permitted by her physician.  Eward Wonda Sharps, [place] 11/03/24 11:29 AM

## 2024-11-04 DIAGNOSIS — Z801 Family history of malignant neoplasm of trachea, bronchus and lung: Secondary | ICD-10-CM | POA: Insufficient documentation

## 2024-11-04 DIAGNOSIS — Z803 Family history of malignant neoplasm of breast: Secondary | ICD-10-CM | POA: Insufficient documentation

## 2024-11-04 DIAGNOSIS — Z808 Family history of malignant neoplasm of other organs or systems: Secondary | ICD-10-CM | POA: Insufficient documentation

## 2024-11-04 DIAGNOSIS — Z8042 Family history of malignant neoplasm of prostate: Secondary | ICD-10-CM | POA: Insufficient documentation

## 2024-11-04 NOTE — Progress Notes (Addendum)
 REFERRING PROVIDER: Vanderbilt Ned, MD [address] [place],  [place] [address]  PRIMARY PROVIDER:  Rolinda Millman, MD  PRIMARY REASON FOR VISIT:  1. Family history of breast cancer   2. Family history of prostate cancer   3. Family history of melanoma   4. Family history of skin cancer   5. Family history of lung cancer    HISTORY OF PRESENT ILLNESS:   Kristina Hudson, a 53 y.o. female, was seen on 11/03/2024 for a [place] cancer genetics consultation in the [facility] at the request of Dr. Vanderbilt due to a personal history of breast cancer.  Kristina Hudson presents to clinic today to discuss the possibility of a hereditary predisposition to cancer, genetic testing, and to further clarify her future cancer risks, as well as potential cancer risks for family members.   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 The treatment plan is to repeat prognostics and do oncotype if ER+ still, and lumpectomy. Rest of treatment will be determined depending on repeat prognostics.  RELEVANT MEDICAL HISTORY AND RISK FACTORS:  Menarche was at age 72.  First live birth at age 61.  OCP use for approximately 8 years.  Ovaries intact: not assessed.  Hysterectomy: not assessed.  Menopausal status: perimenopausal.  Up to date with pelvic exams: yes. Colonoscopy: yes; unknown pathology.  Past Medical History:  Diagnosis Date   Family history of breast cancer    Family history of lung cancer    Family history of melanoma    Family history of prostate cancer    Family history of skin cancer    Gallstones     Past Surgical History:  Procedure Laterality Date   BREAST BIOPSY Right 10/20/2024   US  RT BREAST BX W LOC DEV 1ST LESION IMG BX SPEC US  GUIDE 10/20/2024 [facility] MAMMOGRAPHY   CHOLECYSTECTOMY      GANGLION CYST EXCISION      Social History   Socioeconomic History   Marital status: Married    Spouse name: Not on file   Number of children: Not on file   Years of education: Not on file   Highest education level: Not on file  Occupational History   Not on file  Tobacco Use   Smoking status: Never   Smokeless tobacco: Not on file  Substance and Sexual Activity   Alcohol use: Yes    Comment: 0-2/week   Drug use: Never   Sexual activity: Not on file  Other Topics Concern   Not on file  Social History Narrative   Not on file   Social Drivers of Health   Tobacco Use: Unknown (11/03/2024)   Patient History    Smoking Tobacco Use: Never    Smokeless Tobacco Use: Unknown    Passive Exposure: Not on file  Financial Resource Strain: Not on file  Food Insecurity: No Food Insecurity (11/03/2024)   Epic    Worried About Programme Researcher, Broadcasting/film/video in the Last Year: Never true    Ran Out of Food in the Last Year: Never true  Transportation Needs: No Transportation Needs (11/03/2024)   Epic    Lack of Transportation (Medical): No    Lack of Transportation (Non-Medical): No  Physical Activity: Not on file  Stress: Not on file  Social Connections: Not on file  Depression (PHQ2-9): Low Risk (11/03/2024)   Depression (PHQ2-9)    PHQ-2 Score: 0  Alcohol Screen: Not on file  Housing: Low Risk (11/03/2024)   Epic    Unable to Pay for Housing in the Last Year: No    Number of Times Moved in the Last Year: 0    Homeless in the Last Year: No  Recent Concern: Housing - High Risk (10/29/2024)   Epic    Unable to Pay for Housing in the Last Year: Yes    Number of Times Moved in the Last Year: 0    Homeless in the Last Year: No  Utilities: Not At Risk (11/03/2024)   Epic    Threatened with loss of utilities: No  Health Literacy: Not on file    FAMILY HISTORY:  We obtained a detailed, 4-generation family history.  Significant diagnoses are listed below: Family History  Problem Relation  Age of Onset   Melanoma Mother        arm   Skin cancer Mother        non-melanoma, multiple, arms and legs   Prostate cancer Paternal Uncle    Lung cancer Paternal Grandfather        tobacco use   Breast cancer Other 32       d.mid 30s    Kristina Hudson reports the following family history:  Her mother has been diagnosed with one melanoma skin cancer on her arm and also 4 non-melanoma skin cancers on the arms and legs. Her maternal great aunt who is a twin of her maternal grandfather had breast cancer at age 72 and died in her mis 30s.  Her paternal uncle was diagnosed with prostate cancer at age 68. Her paternal uncle was diagnosed with lung cancer and used tobacco.  There is no reported Ashkenazi Jewish ancestry. There is no known consanguinity.  GENETIC COUNSELING ASSESSMENT:  Kristina Hudson is a 53 y.o. female with a personal and family history of breast cancer which is somewhat suggestive of a hereditary cancer syndrome and predisposition to cancer given this history. We, therefore, discussed and recommended the following at today's visit.   DISCUSSION: We discussed that, in general, most cancer is not inherited in families, but instead is sporadic or familial. Sporadic cancers occur by chance and typically happen at older ages (>50 years) as this type of cancer is caused by genetic changes acquired during an individuals lifetime. Some families have more cancers than would be expected by chance; however, the ages or types of cancer are not consistent with a known genetic mutation or known genetic mutations have been ruled out. This type of familial cancer is thought to be due to a combination of multiple genetic, environmental, hormonal, and lifestyle factors. While this combination of factors likely increases the risk of cancer, the exact source of this risk is not currently identifiable or testable.  We discussed that 5 - 10% of breast cancer is hereditary. Most cases of hereditary breast  cancer are associated with BRCA1 and BRCA2 genes, although there are other genes associated with hereditary breast cancer as well. Cancer risks and management strategies are gene specific. We discussed that genetic testing can beneficial for several reasons, including clarifying specific cancer risks, identifying potential screening and risk-reduction options that may be appropriate, and to understand if other family members could be at risk for cancer and allow them to undergo genetic testing to clarify their cancer risks.   We reviewed the characteristics, features and inheritance patterns  of hereditary cancer syndromes. We also discussed genetic testing, including the appropriate family members to test, the process of testing, insurance coverage and turn-around-time for results. We discussed the implications of a negative, positive, carrier and/or variant of uncertain significant result.   Kristina Hudson  was offered the [facility] BRCAPlus gene panel which includes sequencing and rearrangement analysis for the following 13 genes: ATM, BARD1, BRCA1, BRCA2, CDH1, CHEK2, NF1, PALB2, PTEN, RAD51C, RAD51D, STK11 and TP53 (sequencing and deletion/duplication). This panel was offered in conjunction with the following two comprehensive panels.   Kristina Hudson was offered the [facility] CancerNext + RNAinsight gene panel which includes sequencing, rearrangement analysis, and RNA analysis for the following 40 genes: APC, ATM, BAP1, BARD1, BMPR1A, BRCA1, BRCA2, BRIP1, CDH1, CDKN2A, CHEK2, FH, FLCN, MET, MLH1, MSH2, MSH6, MUTYH, NF1, NTHL1, PALB2, PMS2, PTEN, RAD51C, RAD51D, RPS20, SMAD4, STK11, TP53, TSC1, TSC2, and VHL (sequencing and deletion/duplication); AXIN2, HOXB13, MBD4, MSH3, POLD1 and POLE (sequencing only); EPCAM and GREM1 (deletion/duplication only).   Kristina Hudson was also offered the [facility] CancerNext-Expanded + RNAinsight gene panel which includes sequencing, rearrangement, and RNA analysis for the following 77 genes: AIP,  ALK, APC, ATM, AXIN2, BAP1, BARD1, BMPR1A, BRCA1, BRCA2, BRIP1, CDC73, CDH1, CDK4, CDKN1B, CDKN2A, CEBPA, CHEK2, CTNNA1, DDX41, DICER1, ETV6, FH, FLCN, GATA2, LZTR1, MAX, MBD4, MEN1, MET, MLH1, MSH2, MSH3, MSH6, MUTYH, NF1, NF2, NTHL1, PALB2, PHOX2B, PMS2, POT1, PRKAR1A, PTCH1, PTEN, RAD51C, RAD51D, RB1, RET, RPS20, RUNX1, SDHA, SDHAF2, SDHB, SDHC, SDHD, SMAD4, SMARCA4, SMARCB1, SMARCE1, STK11, SUFU, TMEM127, TP53, TSC1, TSC2, VHL, and WT1 (sequencing and deletion/duplication); EGFR, HOXB13, KIT, MITF, PDGFRA, POLD1, and POLE (sequencing only); EPCAM and GREM1 (deletion/duplication only).    Kristina Hudson was informed of the benefits and limitations of each panel, including that expanded pan-cancer panels contain genes may not have clear management guidelines at this point in time. We also discussed that as the number of genes included on a panel increases, the chances of variants of uncertain significance increases. After considering the risks, benefits, and limitations, Kristina Hudson provided informed consent to pursue genetic testing. Kristina Hudson decided to pursue genetic testing for the [facility] CancerNext 40 gene panel.   Based on Kristina Hudson's personal and family history of cancer, she meets medical criteria for genetic testing. she meets criteria due to her personal history of breast cancer and her paternal great aunt's diagnosis of breast cancer under the age of 53. Despite that she meets criteria, she may still have an out of pocket cost. We discussed that if her out of pocket cost for testing is over $100, the laboratory will call and confirm whether she wants to proceed with testing.  If the out of pocket cost of testing is less than $100 she will be billed by the genetic testing laboratory.   We discussed that some people do not want to undergo genetic testing due to fear of genetic discrimination.  The Genetic Information Nondiscrimination Act (GINA) was signed into federal law in 2008. GINA prohibits health  insurers and most employers from discriminating against individuals based on genetic information (including the results of genetic tests and family history information). According to GINA, health insurance companies cannot consider genetic information to be a preexisting condition, nor can they use it to make decisions regarding coverage or rates. GINA also makes it illegal for most employers to use genetic information in making decisions about hiring, firing, promotion, or terms of employment. It is important to note that GINA does not offer protections for life insurance, disability  insurance, or long-term care insurance. GINA does not apply to those in the [employer], those who work for companies with less than 15 employees, and new life insurance or long-term disability insurance policies.  Health status due to a cancer diagnosis is not protected under GINA. More information about GINA can be found by visiting eliteclients.be.  PLAN: After considering the risks, benefits, and limitations, Kristina Hudson provided informed consent to pursue genetic testing and the blood sample was sent to [facility] for analysis of the BRCAPlus 13 gene panel which will run concurrently with the [facility] CancerNext 40 gene panel. Results should be available within approximately 7-10 days for the [facility] panel and 2-3 weeks' time for the CancerNext Panel. Results will be disclosed by telephone to Kristina Hudson, as will any additional recommendations warranted by these results. Kristina Hudson will receive a summary of her genetic counseling visit and a copy of her results once available. This information will also be available in Epic.   RESOURCES PROVIDED:  Kristina Hudson was provided with the following:  [facility] Billing information  [facility] Contact card   Lastly, we encouraged Kristina Hudson to remain in contact with cancer genetics annually so that we can continuously update the family history and inform her of any  changes in cancer genetics and testing that may be of benefit for this family.   Kristina Hudson questions were answered to her satisfaction today. Our contact information was provided should additional questions or concerns arise. Thank you for the referral and allowing us  to share in the care of your patient.   Suda Forbess R. Bluford, MS, [facility] Certified [email or web address] phone: [phone]  I personally spent a total of 25 minutes in the care of the patient today including preparing to see the patient, getting/reviewing separately obtained history, counseling and educating, placing orders, referring and communicating with other health care professionals, and documenting clinical information in the EHR. The patient brought her husband Acupuncturist. Drs. Lanny Stalls, and/or Gudena were available for questions, if needed.   _______________________________________________________________________ For Office Staff:  Number of people involved in session: 2 Was an Intern/ student involved with case: no

## 2024-11-09 DIAGNOSIS — Z170 Estrogen receptor positive status [ER+]: Secondary | ICD-10-CM | POA: Diagnosis not present

## 2024-11-09 DIAGNOSIS — C50211 Malignant neoplasm of upper-inner quadrant of right female breast: Secondary | ICD-10-CM | POA: Diagnosis not present

## 2024-11-10 ENCOUNTER — Other Ambulatory Visit: Payer: Self-pay | Admitting: Surgery

## 2024-11-10 ENCOUNTER — Telehealth: Payer: Self-pay

## 2024-11-10 DIAGNOSIS — C50911 Malignant neoplasm of unspecified site of right female breast: Secondary | ICD-10-CM

## 2024-11-10 NOTE — Telephone Encounter (Signed)
 I called Ms. Gipe to disclose the first part of her genetic testing results. She informed me it was not the best time and said she would call me back tomorrow around 11:30am. I provided my direct contact number for a callback.

## 2024-11-11 ENCOUNTER — Ambulatory Visit: Payer: Self-pay

## 2024-11-11 ENCOUNTER — Telehealth: Payer: Self-pay | Admitting: *Deleted

## 2024-11-11 ENCOUNTER — Encounter: Payer: Self-pay | Admitting: *Deleted

## 2024-11-11 ENCOUNTER — Telehealth: Payer: Self-pay

## 2024-11-11 DIAGNOSIS — Z801 Family history of malignant neoplasm of trachea, bronchus and lung: Secondary | ICD-10-CM

## 2024-11-11 DIAGNOSIS — Z803 Family history of malignant neoplasm of breast: Secondary | ICD-10-CM

## 2024-11-11 DIAGNOSIS — Z1379 Encounter for other screening for genetic and chromosomal anomalies: Secondary | ICD-10-CM

## 2024-11-11 DIAGNOSIS — Z808 Family history of malignant neoplasm of other organs or systems: Secondary | ICD-10-CM

## 2024-11-11 DIAGNOSIS — Z8042 Family history of malignant neoplasm of prostate: Secondary | ICD-10-CM

## 2024-11-11 DIAGNOSIS — Z170 Estrogen receptor positive status [ER+]: Secondary | ICD-10-CM

## 2024-11-11 NOTE — Telephone Encounter (Signed)
 I contacted Ms. Gut to discuss her genetic testing results. The test that was ordered was the [employer] [facility].  [facility] BRCAPlus gene panel includes sequencing and rearrangement analysis for the following 13 genes: ATM, BARD1, BRCA1, BRCA2, CDH1, CHEK2, NF1, PALB2, PTEN, RAD51C, RAD51D, STK11 and TP53 (sequencing and deletion/duplication).  [facility] CancerNext + RNAinsight gene panel which sequencing, rearrangement analysis, and RNA analysis for the following 40 genes: APC, ATM, BAP1, BARD1, BMPR1A, BRCA1, BRCA2, BRIP1, CDH1, CDKN2A, CHEK2, FH, FLCN, MET, MLH1, MSH2, MSH6, MUTYH, NF1, NTHL1, PALB2, PMS2, PTEN, RAD51C, RAD51D, RPS20, SMAD4, STK11, TP53, TSC1, TSC2, and VHL (sequencing and deletion/duplication); AXIN2, HOXB13, MBD4, MSH3, POLD1 and POLE (sequencing only); EPCAM and GREM1 (deletion/duplication only).   The report dates for both were 11/10/2024. No pathogenic variants were identified in the 40 genes analyzed. Detailed clinic note to follow.   Discussed that this does not explain breast cancer.  It could be due to sporadic and environmental factors, a different gene that we are not testing, or maybe our current technology may not be able to pick something up.  It will be important for her to keep in contact with genetics to keep up with whether additional testing may be needed.    The test report has been scanned into EPIC and is located under the Molecular Pathology section of the Results Review tab.  A portion of the result report is included below for reference.      Warren Ahle, MS, [facility] Cancer Genetic Counselor [email or web address] [phone]

## 2024-11-11 NOTE — Telephone Encounter (Signed)
 Spoke with patient to follow up from [facility] 12/10 and assess navigation needs. Patient denies any questions or concerns.  She did say that she met with the medical oncologist at [facility] and is unsure at this time whether she will go to [facility] or here for chemo if needed.   She will call me and let me know.  Encouraged her to call with any other questions.

## 2024-11-11 NOTE — Progress Notes (Addendum)
 HPI:  Ms. Siemen was previously seen in the [facility] clinic due to a personal history of breast cancer and concerns regarding a hereditary predisposition to cancer. Please refer to our prior cancer genetics clinic note for more information regarding our discussion, assessment and recommendations, at the time. Ms. Essner recent genetic test results were disclosed to her, as were recommendations warranted by these results. These results and recommendations are discussed in more detail below.  CANCER HISTORY:  Oncology History  Malignant neoplasm of upper-outer quadrant of right breast in female, estrogen receptor positive (HCC)  11/01/2024 Initial Diagnosis   Malignant neoplasm of upper-outer quadrant of right breast in female, estrogen receptor positive (HCC)   11/10/2024 Genetic Testing   Negative [facility] CancerNext + RNAinsight. Report date 11/10/2024. [facility] CancerNext + RNAinsight gene panel includes sequencing, rearrangement analysis, and RNA analysis for the following 40 genes: APC, ATM, BAP1, BARD1, BMPR1A, BRCA1, BRCA2, BRIP1, CDH1, CDKN2A, CHEK2, FH, FLCN, MET, MLH1, MSH2, MSH6, MUTYH, NF1, NTHL1, PALB2, PMS2, PTEN, RAD51C, RAD51D, RPS20, SMAD4, STK11, TP53, TSC1, TSC2, and VHL (sequencing and deletion/duplication); AXIN2, HOXB13, MBD4, MSH3, POLD1 and POLE (sequencing only); EPCAM and GREM1 (deletion/duplication only).   Negative [facility] BRCAPlus. Report date 11/10/2024. [facility] BRCAPlus gene panel includes sequencing and rearrangement analysis for the following 13 genes: ATM, BARD1, BRCA1, BRCA2, CDH1, CHEK2, NF1, PALB2, PTEN, RAD51C, RAD51D, STK11 and TP53 (sequencing and deletion/duplication).      FAMILY HISTORY:  We obtained a detailed, 4-generation family history.  Significant diagnoses are listed below: Family History  Problem Relation Age of Onset   Melanoma Mother        arm   Skin cancer Mother        non-melanoma, multiple, arms and legs   Prostate cancer Paternal  Uncle    Lung cancer Paternal Grandfather        tobacco use   Breast cancer Other 32       d.mid 30s       Ms. Mclamb reports the following family history:   Her mother has been diagnosed with one melanoma skin cancer on her arm and also 4 non-melanoma skin cancers on the arms and legs. Her maternal great aunt who is a twin of her maternal grandfather had breast cancer at age 8 and died in her mis 30s.   Her paternal uncle was diagnosed with prostate cancer at age 5. Her paternal uncle was diagnosed with lung cancer and used tobacco.   There is no reported Ashkenazi Jewish ancestry. There is no known consanguinity.   GENETIC TEST RESULTS: Genetic testing reported out on 11/10/2024 through the [facility] BRCAPlus 13 gene panel which was run concurrently with the [facility] CancerNext 40 gene panel.  The [facility] BRCAPlus gene panel includes sequencing and rearrangement analysis for the following 13 genes: ATM, BARD1, BRCA1, BRCA2, CDH1, CHEK2, NF1, PALB2, PTEN, RAD51C, RAD51D, STK11 and TP53 (sequencing and deletion/duplication).  The [facility] CancerNext + RNAinsight gene panel includes sequencing, rearrangement analysis, and RNA analysis for the following 40 genes: APC, ATM, BAP1, BARD1, BMPR1A, BRCA1, BRCA2, BRIP1, CDH1, CDKN2A, CHEK2, FH, FLCN, MET, MLH1, MSH2, MSH6, MUTYH, NF1, NTHL1, PALB2, PMS2, PTEN, RAD51C, RAD51D, RPS20, SMAD4, STK11, TP53, TSC1, TSC2, and VHL (sequencing and deletion/duplication); AXIN2, HOXB13, MBD4, MSH3, POLD1 and POLE (sequencing only); EPCAM and GREM1 (deletion/duplication only).   These cancer panels found no pathogenic mutations in any of the genes listed above. The test report has been scanned into EPIC and is located under the Molecular Pathology  section of the Results Review tab.  A portion of the result report is included below for reference.     We discussed with Ms. Dormer that because current genetic testing is not perfect, it is possible there may be a gene  mutation in one of these genes that current testing cannot detect, but that chance is small.  We also discussed, that there could be another gene that has not yet been discovered, or that we have not yet tested, that is responsible for the cancer diagnoses in the family. It is also possible there is a hereditary cause for the cancer in the family that Ms. Dedic did not inherit and therefore was not identified in her testing.  Therefore, it is important to remain in touch with cancer genetics in the future so that we can continue to offer Ms. Cina the most up to date genetic testing.   ADDITIONAL GENETIC TESTING: We discussed with Ms. Bakken that her genetic testing was fairly extensive.  If there are genes identified to increase cancer risk that can be analyzed in the future, we would be happy to discuss and coordinate this testing at that time.    CANCER SCREENING RECOMMENDATIONS: Ms. Pellicane test result is considered negative (normal).  This means that we have not identified a hereditary cause for her personal and family history of breast cancer at this time. Most cancers happen by chance and this negative test suggests that her personal and family history of breast cancer may fall into this category.    Possible reasons for Ms. Demarinis's negative genetic test include:  1. There may be a gene mutation in one of these genes that current testing methods cannot detect but that chance is small.  2. There could be another gene that has not yet been discovered, or that we have not yet tested, that is responsible for the cancer diagnoses in the family.  3.  There may be no hereditary risk for cancer in the family. The cancers in Ms. Court and/or her family may be sporadic/familial or due to other genetic and environmental factors. 4. It is also possible there is a hereditary cause for the cancer in the family that Ms. Abshier did not inherit.  Therefore, it is recommended she continue to follow the cancer  management and screening guidelines provided by her oncology and primary healthcare provider. An individual's cancer risk and medical management are not determined by genetic test results alone. Overall cancer risk assessment incorporates additional factors, including personal medical history, family history, and any available genetic information that may result in a personalized plan for cancer prevention and surveillance  RECOMMENDATIONS FOR FAMILY MEMBERS:   Since she did not inherit a identifiable mutation in a cancer predisposition gene included on this panel, her children could not have inherited a known mutation from her in one of these genes. Individuals in this family might be at some increased risk of developing cancer, over the general population risk, simply due to the family history of cancer.  We recommended women in this family have a yearly mammogram beginning at age 50, or 7 years younger than the earliest onset of cancer, an annual clinical breast exam, and perform monthly breast self-exams. Women in this family should also have a gynecological exam as recommended by their primary provider. All family members should be referred for colonoscopy starting at age 75, or 22 years younger than the earliest onset of cancer. Ms. Mirante informed me that her daughter's paternal grandfather had  colon cancer in his 20s and he is still living at age 56. I recommended this individual have genetic counseling and testing due to a diagnosis of colon cancer under the age of 78. If he does is not willing/able to test, we discussed the next best person would be her daughter's father. We remain available to help coordinate testing for family members, or family members can either ask their PCP for a referral to genetic counseling or use the [employer] Find a Animal Nutritionist to find a dentist near them.  FOLLOW-UP: Lastly, we discussed with Ms. Kliewer that cancer  genetics is a rapidly advancing field and it is possible that new genetic tests will be appropriate for her and/or her family members in the future. We encouraged her to remain in contact with cancer genetics on an annual basis so we can update her personal and family histories and let her know of advances in cancer genetics that may benefit this family.   Our contact number was provided. Ms. Windish questions were answered to her satisfaction, and she knows she is welcome to call us  at anytime with additional questions or concerns.   Warren Ahle, MS, [facility] Cancer Genetic Counselor [email or web address] [phone]

## 2024-11-12 ENCOUNTER — Telehealth: Payer: Self-pay | Admitting: Hematology and Oncology

## 2024-11-12 NOTE — Telephone Encounter (Signed)
 I left voicemail for patient regarding post op appointment with MD on 01/13/2025.

## 2024-12-02 ENCOUNTER — Encounter: Payer: Self-pay | Admitting: Physical Therapy

## 2024-12-13 ENCOUNTER — Encounter (HOSPITAL_BASED_OUTPATIENT_CLINIC_OR_DEPARTMENT_OTHER): Payer: Self-pay | Admitting: Surgery

## 2024-12-13 ENCOUNTER — Other Ambulatory Visit: Payer: Self-pay

## 2024-12-13 NOTE — Progress Notes (Signed)
" °   12/13/24 1318  PAT Phone Screen  Is the patient taking a GLP-1 receptor agonist? No  Do You Have Diabetes? No  Do You Have Hypertension? No  Have You Ever Been to the ER for Asthma? No  Have You Taken Oral Steroids in the Past 3 Months? No  Do you Take Phenteramine or any Other Diet Drugs? No  Recent  Lab Work, EKG, CXR? No  Do you have a history of heart problems? No  Any Recent Hospitalizations? No  Height 5' 2 (1.575 m)  Weight 74.8 kg  Pat Appointment Scheduled Yes    "

## 2024-12-13 NOTE — Progress Notes (Signed)
" °   12/13/24 1318  PAT Phone Screen  Is the patient taking a GLP-1 receptor agonist? No  Do You Have Diabetes? No  Do You Have Hypertension? No  Have You Ever Been to the ER for Asthma? No  Have You Taken Oral Steroids in the Past 3 Months? No  Do you Take Phenteramine or any Other Diet Drugs? No  Recent  Lab Work, EKG, CXR? No  Do you have a history of heart problems? No  Any Recent Hospitalizations? No  Height 5' 2 (1.575 m)  Weight 74.8 kg  Pat Appointment Scheduled Yes (eras)    "

## 2024-12-16 MED ORDER — CHLORHEXIDINE GLUCONATE CLOTH 2 % EX PADS: 6.0000 | MEDICATED_PAD | Freq: Once | CUTANEOUS | Status: DC

## 2024-12-16 NOTE — Progress Notes (Signed)

## 2024-12-17 ENCOUNTER — Ambulatory Visit
Admission: RE | Admit: 2024-12-17 | Discharge: 2024-12-17 | Disposition: A | Source: Ambulatory Visit | Attending: Surgery | Admitting: Surgery

## 2024-12-17 ENCOUNTER — Other Ambulatory Visit: Payer: Self-pay | Admitting: Surgery

## 2024-12-17 DIAGNOSIS — C50911 Malignant neoplasm of unspecified site of right female breast: Secondary | ICD-10-CM

## 2024-12-20 ENCOUNTER — Encounter (HOSPITAL_BASED_OUTPATIENT_CLINIC_OR_DEPARTMENT_OTHER): Payer: Self-pay | Admitting: Surgery

## 2024-12-20 NOTE — Anesthesia Preprocedure Evaluation (Signed)
"                                    Anesthesia Evaluation  Patient identified by MRN, date of birth, ID band Patient awake    Reviewed: Allergy & Precautions, NPO status , Patient's Chart, lab work & pertinent test results  History of Anesthesia Complications Negative for: history of anesthetic complications  Airway Mallampati: III  TM Distance: >3 FB Neck ROM: Full    Dental  (+) Dental Advisory Given   Pulmonary neg pulmonary ROS   breath sounds clear to auscultation       Cardiovascular negative cardio ROS  Rhythm:Regular Rate:Normal     Neuro/Psych negative neurological ROS     GI/Hepatic Neg liver ROS,GERD  Medicated and Controlled,,  Endo/Other  BMI 31  Renal/GU negative Renal ROS     Musculoskeletal   Abdominal   Peds  Hematology negative hematology ROS (+)   Anesthesia Other Findings   Reproductive/Obstetrics                              Anesthesia Physical Anesthesia Plan  ASA: 2  Anesthesia Plan: General   Post-op Pain Management: Tylenol  PO (pre-op)*   Induction: Intravenous  PONV Risk Score and Plan: 3 and Ondansetron , Dexamethasone  and Scopolamine  patch - Pre-op  Airway Management Planned: LMA  Additional Equipment: None  Intra-op Plan:   Post-operative Plan:   Informed Consent: I have reviewed the patients History and Physical, chart, labs and discussed the procedure including the risks, benefits and alternatives for the proposed anesthesia with the patient or authorized representative who has indicated his/her understanding and acceptance.     Dental advisory given  Plan Discussed with: CRNA and Surgeon  Anesthesia Plan Comments: (Plan routine monitors, GA with pectoralis block for post op analgesia)         Anesthesia Quick Evaluation  "

## 2024-12-20 NOTE — H&P (Signed)
 "  .    Chief Complaint: No chief complaint on file.  History of Present Illness: Kristina Hudson is a 54 y.o. female who is seen today as an office consultation for evaluation of No chief complaint on file.  Patient seen today in the [facility] due to abnormal right mammogram. She was found to have a right breast mass measuring 1.3 cm in the upper inner quadrant. 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 She has no complaints. No family history of breast cancer and this was her first breast biopsy.  Review of Systems: A complete review of systems was obtained from the patient. I have reviewed this information and discussed as appropriate with the patient. See HPI as well for other ROS.    Medical History: Past Medical History: Diagnosis Date Anemia Anxiety GERD (gastroesophageal reflux disease)  There is no problem list on file for this patient.  Past Surgical History: Procedure Laterality Date EXCISION GANGLION CYST WRIST PRIMARY Left 1988 WRIST GANGLION EXCISION Right 2017 PERCUTANEOUS BIOPSY BREAST W/NEEDLE LOCALIZATION Right 10/20/2024 CHOLECYSTECTOMY   Allergies Allergen Reactions Shellfish Derived Anaphylaxis Codeine Diarrhea, Other (See Comments) and Nausea And Vomiting Shellfish Containing Products Diarrhea  Current Outpatient Medications on File Prior to Visit Medication Sig Dispense Refill citalopram (CELEXA) 40 MG tablet Take 40 mg by mouth once daily cyanocobalamin  (VITAMIN B12) 1000 MCG tablet Take 1,000 mcg by mouth once daily ferrous sulfate 325 (65 FE) MG tablet Take 325 mg by mouth daily with breakfast L-LYSINE ORAL Take by mouth magnesium oxide (MAG-OX) 400 mg (241.3 mg magnesium) tablet Take 400 mg by mouth once daily multivitamin tablet Take 1 tablet by mouth once daily pantoprazole (PROTONIX) 40 MG DR tablet Take 40 mg by mouth once daily rOPINIRole (REQUIP) 0.5 MG immediate release  tablet TAKE 1 TO 2 TABLETS BY MOUTH EVERY NIGHT 1 TO 3 HOURS BEFORE BEDTIME  No current facility-administered medications on file prior to visit.  Family History Problem Relation Age of Onset Skin cancer Mother Deep vein thrombosis (DVT or abnormal blood clot formation) Mother High blood pressure (Hypertension) Father Coronary Artery Disease (Blocked arteries around heart) Father   Social History  Tobacco Use Smoking Status Never Smokeless Tobacco Never   Social History  Socioeconomic History Marital status: Married Tobacco Use Smoking status: Never Smokeless tobacco: Never Substance and Sexual Activity Alcohol use: Yes Alcohol/week: 0.0 - 1.0 standard drinks of alcohol Drug use: Never  Social Drivers of Health  Food Insecurity: No Food Insecurity (10/29/2024) Received from [facility] Hunger Vital Sign Within the past 12 months, you worried that your food would run out before you got the money to buy more.: Never true Within the past 12 months, the food you bought just didn't last and you didn't have money to get more.: Never true Transportation Needs: No Transportation Needs (10/29/2024) Received from [facility] - Transportation In the past 12 months, has lack of transportation kept you from medical appointments or from getting medications?: No In the past 12 months, has lack of transportation kept you from meetings, work, or from getting things needed for daily living?: No Housing Stability: Unknown (11/03/2024) Housing Stability Vital Sign Homeless in the Last Year: No  Objective: There were no vitals filed for this visit. There is no height or weight on file to calculate BMI.  Physical Exam Exam conducted with a chaperone present. Cardiovascular: Rate and Rhythm: Normal rate. Pulmonary: Effort: Pulmonary effort is  normal. Chest: Breasts: Right: Mass present. No inverted nipple, skin change or tenderness. Left: No inverted nipple, mass, skin  change or tenderness.  Musculoskeletal: Cervical back: Normal range of motion. Lymphadenopathy: Upper Body: Right upper body: No supraclavicular or axillary adenopathy. Left upper body: No supraclavicular or axillary adenopathy. Skin: General: Skin is warm. Neurological: General: No focal deficit present. Mental Status: She is alert. Psychiatric: Mood and Affect: Mood normal.    Labs, Imaging and Diagnostic Testing: Mammogram shows a 1.3 cm mass right upper inner quadrant core biopsy with above findings.  Assessment and Plan:  Diagnoses and all orders for this visit:  Breast cancer, stage 1, right (CMS/HHS-HCC)   Discussed breast conserving surgery versus mastectomy reconstruction.  Natural history and pathophysiology of breast cancer reviewed  Long-term survival, local recurrence and quality life reviewed with all surgical options.  Discussed potential port placement if needed  She has opted for right breast seed localized lumpectomy with right axillary sentinel lymph node mapping. Discussed lymphedema risk  The procedure has been discussed with the patient. Alternatives to surgery have been discussed with the patient. Risks of surgery include bleeding, Infection, Seroma formation, death, and the need for further surgery. The patient understands and wishes to proceed.   DEBBY CURTISTINE SHIPPER, MD  I spent a total of 47 minutes in both face-to-face and non-face-to-face activities, excluding procedures performed, for this visit on the date of this encounter.       "

## 2024-12-21 ENCOUNTER — Encounter (HOSPITAL_BASED_OUTPATIENT_CLINIC_OR_DEPARTMENT_OTHER)
Admission: RE | Disposition: A | Discharge status: Home / Self Care | Payer: Self-pay | Source: Home / Self Care | Attending: Surgery

## 2024-12-21 ENCOUNTER — Ambulatory Visit (HOSPITAL_BASED_OUTPATIENT_CLINIC_OR_DEPARTMENT_OTHER)
Admission: RE | Admit: 2024-12-21 | Discharge: 2024-12-21 | Disposition: A | Discharge status: Home / Self Care | Attending: Surgery | Admitting: Surgery

## 2024-12-21 ENCOUNTER — Encounter (HOSPITAL_BASED_OUTPATIENT_CLINIC_OR_DEPARTMENT_OTHER): Payer: Self-pay | Admitting: Anesthesiology

## 2024-12-21 ENCOUNTER — Encounter (HOSPITAL_BASED_OUTPATIENT_CLINIC_OR_DEPARTMENT_OTHER): Payer: Self-pay | Admitting: Surgery

## 2024-12-21 ENCOUNTER — Other Ambulatory Visit: Payer: Self-pay

## 2024-12-21 ENCOUNTER — Ambulatory Visit
Admission: RE | Admit: 2024-12-21 | Discharge: 2024-12-21 | Disposition: A | Source: Ambulatory Visit | Attending: Surgery | Admitting: Surgery

## 2024-12-21 DIAGNOSIS — Z808 Family history of malignant neoplasm of other organs or systems: Secondary | ICD-10-CM | POA: Diagnosis not present

## 2024-12-21 DIAGNOSIS — C50911 Malignant neoplasm of unspecified site of right female breast: Secondary | ICD-10-CM

## 2024-12-21 DIAGNOSIS — Z170 Estrogen receptor positive status [ER+]: Secondary | ICD-10-CM | POA: Diagnosis not present

## 2024-12-21 DIAGNOSIS — C50211 Malignant neoplasm of upper-inner quadrant of right female breast: Secondary | ICD-10-CM | POA: Insufficient documentation

## 2024-12-21 DIAGNOSIS — K219 Gastro-esophageal reflux disease without esophagitis: Secondary | ICD-10-CM | POA: Diagnosis not present

## 2024-12-21 HISTORY — DX: Gastro-esophageal reflux disease without esophagitis: K21.9

## 2024-12-21 HISTORY — DX: Restless legs syndrome: G25.81

## 2024-12-21 MED ORDER — PROPOFOL 10 MG/ML IV BOLUS
INTRAVENOUS | Status: DC | PRN
  Administered 2024-12-21: 150 mg via INTRAVENOUS

## 2024-12-21 MED ORDER — FENTANYL CITRATE (PF) 100 MCG/2ML IJ SOLN
100.0000 ug | Freq: Once | INTRAMUSCULAR | Status: AC
  Administered 2024-12-21: 100 ug via INTRAVENOUS

## 2024-12-21 MED ORDER — DEXAMETHASONE SOD PHOSPHATE PF 10 MG/ML IJ SOLN
INTRAMUSCULAR | Status: AC
  Filled 2024-12-21: qty 1

## 2024-12-21 MED ORDER — MEPERIDINE HCL 25 MG/ML IJ SOLN: 6.2500 mg | INTRAMUSCULAR | Status: DC | PRN

## 2024-12-21 MED ORDER — BUPIVACAINE-EPINEPHRINE (PF) 0.25% -1:200000 IJ SOLN
INTRAMUSCULAR | Status: DC | PRN
  Administered 2024-12-21: 16 mL

## 2024-12-21 MED ORDER — MAGTRACE LYMPHATIC TRACER
INTRAMUSCULAR | Status: DC | PRN
  Administered 2024-12-21: 2 mL via INTRAMUSCULAR

## 2024-12-21 MED ORDER — FENTANYL CITRATE (PF) 100 MCG/2ML IJ SOLN
INTRAMUSCULAR | Status: DC | PRN
  Administered 2024-12-21 (×2): 50 ug via INTRAVENOUS

## 2024-12-21 MED ORDER — SCOPOLAMINE 1 MG/3DAYS TD PT72
1.0000 | MEDICATED_PATCH | TRANSDERMAL | Status: DC
  Administered 2024-12-21: 1 mg via TRANSDERMAL

## 2024-12-21 MED ORDER — FENTANYL CITRATE (PF) 100 MCG/2ML IJ SOLN
INTRAMUSCULAR | Status: AC
  Filled 2024-12-21: qty 2

## 2024-12-21 MED ORDER — HYDROMORPHONE HCL 1 MG/ML IJ SOLN: 0.2500 mg | INTRAMUSCULAR | Status: DC | PRN

## 2024-12-21 MED ORDER — ONDANSETRON HCL 4 MG/2ML IJ SOLN
INTRAMUSCULAR | Status: DC | PRN
  Administered 2024-12-21: 4 mg via INTRAVENOUS

## 2024-12-21 MED ORDER — MIDAZOLAM HCL 2 MG/2ML IJ SOLN
INTRAMUSCULAR | Status: AC
  Filled 2024-12-21: qty 2

## 2024-12-21 MED ORDER — MIDAZOLAM HCL (PF) 2 MG/2ML IJ SOLN
2.0000 mg | Freq: Once | INTRAMUSCULAR | Status: AC
  Administered 2024-12-21: 2 mg via INTRAVENOUS

## 2024-12-21 MED ORDER — OXYCODONE HCL 5 MG PO TABS: 5.0000 mg | ORAL_TABLET | Freq: Four times a day (QID) | ORAL | 0 refills | Status: AC | PRN

## 2024-12-21 MED ORDER — CEFAZOLIN SODIUM-DEXTROSE 2-4 GM/100ML-% IV SOLN
INTRAVENOUS | Status: AC
  Filled 2024-12-21: qty 100

## 2024-12-21 MED ORDER — LIDOCAINE 2% (20 MG/ML) 5 ML SYRINGE
INTRAMUSCULAR | Status: DC | PRN
  Administered 2024-12-21: 40 mg via INTRAVENOUS

## 2024-12-21 MED ORDER — DEXAMETHASONE SOD PHOSPHATE PF 10 MG/ML IJ SOLN
INTRAMUSCULAR | Status: DC | PRN
  Administered 2024-12-21: 10 mg via INTRAVENOUS

## 2024-12-21 MED ORDER — BUPIVACAINE-EPINEPHRINE (PF) 0.5% -1:200000 IJ SOLN
INTRAMUSCULAR | Status: DC | PRN
  Administered 2024-12-21: 30 mL

## 2024-12-21 MED ORDER — BUPIVACAINE-EPINEPHRINE (PF) 0.25% -1:200000 IJ SOLN
INTRAMUSCULAR | Status: AC
  Filled 2024-12-21: qty 30

## 2024-12-21 MED ORDER — LIDOCAINE 2% (20 MG/ML) 5 ML SYRINGE
INTRAMUSCULAR | Status: AC
  Filled 2024-12-21: qty 5

## 2024-12-21 MED ORDER — ACETAMINOPHEN 500 MG PO TABS
1000.0000 mg | ORAL_TABLET | Freq: Once | ORAL | Status: AC
  Administered 2024-12-21: 1000 mg via ORAL

## 2024-12-21 MED ORDER — OXYCODONE HCL 5 MG/5ML PO SOLN: 5.0000 mg | Freq: Once | ORAL | Status: DC | PRN

## 2024-12-21 MED ORDER — LACTATED RINGERS IV SOLN: INTRAVENOUS | Status: DC

## 2024-12-21 MED ORDER — ACETAMINOPHEN 500 MG PO TABS
ORAL_TABLET | ORAL | Status: AC
  Filled 2024-12-21: qty 2

## 2024-12-21 MED ORDER — SCOPOLAMINE 1 MG/3DAYS TD PT72
MEDICATED_PATCH | TRANSDERMAL | Status: AC
  Filled 2024-12-21: qty 1

## 2024-12-21 MED ORDER — KETOROLAC TROMETHAMINE 30 MG/ML IJ SOLN
INTRAMUSCULAR | Status: AC
  Filled 2024-12-21: qty 1

## 2024-12-21 MED ORDER — EPHEDRINE 5 MG/ML INJ
INTRAVENOUS | Status: AC
  Filled 2024-12-21: qty 5

## 2024-12-21 MED ORDER — EPHEDRINE SULFATE (PRESSORS) 25 MG/5ML IV SOSY
PREFILLED_SYRINGE | INTRAVENOUS | Status: DC | PRN
  Administered 2024-12-21 (×4): 5 mg via INTRAVENOUS

## 2024-12-21 MED ORDER — MIDAZOLAM HCL (PF) 2 MG/2ML IJ SOLN: 0.5000 mg | Freq: Once | INTRAMUSCULAR | Status: DC | PRN

## 2024-12-21 MED ORDER — PHENYLEPHRINE 80 MCG/ML (10ML) SYRINGE FOR IV PUSH (FOR BLOOD PRESSURE SUPPORT)
PREFILLED_SYRINGE | INTRAVENOUS | Status: AC
  Filled 2024-12-21: qty 10

## 2024-12-21 MED ORDER — CEFAZOLIN SODIUM-DEXTROSE 2-4 GM/100ML-% IV SOLN
2.0000 g | INTRAVENOUS | Status: AC
  Administered 2024-12-21: 2 g via INTRAVENOUS

## 2024-12-21 MED ORDER — ONDANSETRON HCL 4 MG/2ML IJ SOLN
INTRAMUSCULAR | Status: AC
  Filled 2024-12-21: qty 2

## 2024-12-21 MED ORDER — OXYCODONE HCL 5 MG PO TABS: 5.0000 mg | ORAL_TABLET | Freq: Once | ORAL | Status: DC | PRN

## 2024-12-21 NOTE — Anesthesia Procedure Notes (Signed)
 Anesthesia Regional Block: Pectoralis block   Pre-Anesthetic Checklist: , timeout performed,  Correct Patient, Correct Site, Correct Laterality,  Correct Procedure, Correct Position, site marked,  Risks and benefits discussed,  Surgical consent,  Pre-op evaluation,  At surgeon's request and post-op pain management  Laterality: Right  Prep: chloraprep       Needles:  Injection technique: Single-shot  Needle Type: Echogenic Needle     Needle Length: 9cm  Needle Gauge: 21     Additional Needles:   Procedures:,,,, ultrasound used (permanent image in chart),,    Narrative:  Start time: 12/21/2024 7:09 AM End time: 12/21/2024 7:15 AM Injection made incrementally with aspirations every 5 mL.  Performed by: Personally  Anesthesiologist: Leonce Athens, MD  Additional Notes: Pt identified in Holding room.  Monitors applied. Working IV access confirmed. Timeout, Sterile prep R clavicle and pec.  #21ga ECHOgenic Arrow block needle between pec minor and serratus, then pec minor and pec major, both between ribs 4,5 with US  guidance.  Total 30cc 0.5% Bupivacaine  1:200k epi injected incrementally after negative test dose.  Patient asymptomatic, VSS, no heme aspirated, tolerated well.  JAYSON Leonce, MD

## 2024-12-21 NOTE — Transfer of Care (Signed)
 Immediate Anesthesia Transfer of Care Note  Patient: Kristina Hudson  Procedure(s) Performed: BREAST LUMPECTOMY WITH RADIOACTIVE SEED AND SENTINEL LYMPH NODE BIOPSY (Right: Breast)  Patient Location: PACU  Anesthesia Type:General and Regional  Level of Consciousness: drowsy  Airway & Oxygen Therapy: Patient Spontanous Breathing and Patient connected to nasal cannula oxygen  Post-op Assessment: Report given to RN and Post -op Vital signs reviewed and stable  Post vital signs: Reviewed and stable  Last Vitals:  Vitals Value Taken Time  BP 105/44 12/21/24 08:58  Temp 36.3 C 12/21/24 09:00  Pulse 69 12/21/24 09:00  Resp 17 12/21/24 09:00  SpO2 95 % 12/21/24 09:00  Vitals shown include unfiled device data.  Last Pain:  Vitals:   12/21/24 0644  PainSc: 0-No pain      Patients Stated Pain Goal: 4 (12/21/24 9355)  Complications: No notable events documented.

## 2024-12-21 NOTE — Progress Notes (Signed)
Assisted Dr. Annye Asa with right, pectoralis, ultrasound guided block. Side rails up, monitors on throughout procedure. See vital signs in flow sheet. Tolerated Procedure well. ?

## 2024-12-21 NOTE — Discharge Instructions (Addendum)
 " Post Anesthesia Home Care Instructions  Activity: Get plenty of rest for the remainder of the day. A responsible individual must stay with you for 24 hours following the procedure.  For the next 24 hours, DO NOT: -Drive a car -Advertising copywriter -Drink alcoholic beverages -Take any medication unless instructed by your physician -Make any legal decisions or sign important papers.  Meals: Start with liquid foods such as gelatin or soup. Progress to regular foods as tolerated. Avoid greasy, spicy, heavy foods. If nausea and/or vomiting occur, drink only clear liquids until the nausea and/or vomiting subsides. Call your physician if vomiting continues.  Special Instructions/Symptoms: Your throat may feel dry or sore from the anesthesia or the breathing tube placed in your throat during surgery. If this causes discomfort, gargle with warm salt water. The discomfort should disappear within 24 hours.  If you had a scopolamine  patch placed behind your ear for the management of post- operative nausea and/or vomiting:  1. The medication in the patch is effective for 72 hours, after which it should be removed.  Wrap patch in a tissue and discard in the trash. Wash hands thoroughly with soap and water. 2. You may remove the patch earlier than 72 hours if you experience unpleasant side effects which may include dry mouth, dizziness or visual disturbances. 3. Avoid touching the patch. Wash your hands with soap and water after contact with the patch.    No Tylenol  until after 12:52pm today if needed      [facility] Phone Number [phone]  BREAST BIOPSY/ PARTIAL MASTECTOMY: POST OP INSTRUCTIONS  Always review your discharge instruction sheet given to you by the facility where your surgery was performed.  IF YOU HAVE DISABILITY OR FAMILY LEAVE FORMS, YOU MUST BRING THEM TO THE OFFICE FOR PROCESSING.  DO NOT GIVE THEM TO YOUR DOCTOR.  A prescription for pain medication  may be given to you upon discharge.  Take your pain medication as prescribed, if needed.  If narcotic pain medicine is not needed, then you may take acetaminophen  (Tylenol ) or ibuprofen (Advil) as needed. Take your usually prescribed medications unless otherwise directed If you need a refill on your pain medication, please contact your pharmacy.  They will contact our office to request authorization.  Prescriptions will not be filled after 5pm or on week-ends. You should eat very light the first 24 hours after surgery, such as soup, crackers, pudding, etc.  Resume your normal diet the day after surgery. Most patients will experience some swelling and bruising in the breast.  Ice packs and a good support bra will help.  Swelling and bruising can take several days to resolve.  It is common to experience some constipation if taking pain medication after surgery.  Increasing fluid intake and taking a stool softener will usually help or prevent this problem from occurring.  A mild laxative (Milk of Magnesia or Miralax) should be taken according to package directions if there are no bowel movements after 48 hours. Unless discharge instructions indicate otherwise, you may remove your bandages 24-48 hours after surgery, and you may shower at that time.  You may have steri-strips (small skin tapes) in place directly over the incision.  These strips should be left on the skin for 7-10 days.  If your surgeon used skin glue on the incision, you may shower in 24 hours.  The glue will flake off over the next 2-3 weeks.  Any sutures or staples will be removed at the office  during your follow-up visit. ACTIVITIES:  You may resume regular daily activities (gradually increasing) beginning the next day.  Wearing a good support bra or sports bra minimizes pain and swelling.  You may have sexual intercourse when it is comfortable. You may drive when you no longer are taking prescription pain medication, you can comfortably wear  a seatbelt, and you can safely maneuver your car and apply brakes. RETURN TO WORK:  ______________________________________________________________________________________ Rosine should see your doctor in the office for a follow-up appointment approximately two weeks after your surgery.  Your doctors nurse will typically make your follow-up appointment when she calls you with your pathology report.  Expect your pathology report 2-3 business days after your surgery.  You may call to check if you do not hear from us  after three days. OTHER INSTRUCTIONS: _______________________________________________________________________________________________ _____________________________________________________________________________________________________________________________________ _____________________________________________________________________________________________________________________________________ _____________________________________________________________________________________________________________________________________  WHEN TO CALL YOUR DOCTOR: Fever over 101.0 Nausea and/or vomiting. Extreme swelling or bruising. Continued bleeding from incision. Increased pain, redness, or drainage from the incision.  The clinic staff is available to answer your questions during regular business hours.  Please dont hesitate to call and ask to speak to one of the nurses for clinical concerns.  If you have a medical emergency, go to the nearest emergency room or call 911.  A surgeon from [facility] is always on call at the hospital.  For further questions, please visit [email or web address]   "

## 2024-12-21 NOTE — Anesthesia Procedure Notes (Signed)
 Procedure Name: LMA Insertion Date/Time: 12/21/2024 7:35 AM  Performed by: Lenard Lacks, CRNAPre-anesthesia Checklist: Patient identified, Emergency Drugs available, Suction available and Patient being monitored Patient Re-evaluated:Patient Re-evaluated prior to induction Oxygen Delivery Method: Circle system utilized Preoxygenation: Pre-oxygenation with 100% oxygen Induction Type: IV induction LMA: LMA inserted LMA Size: 4.0 Number of attempts: 1 Placement Confirmation: positive ETCO2 and breath sounds checked- equal and bilateral Dental Injury: Teeth and Oropharynx as per pre-operative assessment

## 2024-12-21 NOTE — Interval H&P Note (Signed)
 History and Physical Interval Note:  12/21/2024 7:10 AM  Kristina Hudson  has presented today for surgery, with the diagnosis of BREAST CANCER.  The various methods of treatment have been discussed with the patient and family. After consideration of risks, benefits and other options for treatment, the patient has consented to  Procedures with comments: BREAST LUMPECTOMY WITH RADIOACTIVE SEED AND SENTINEL LYMPH NODE BIOPSY (Right) - GEN w/PEC BLOCK as a surgical intervention.  The patient's history has been reviewed, patient examined, no change in status, stable for surgery.  I have reviewed the patient's chart and labs.  Questions were answered to the patient's satisfaction.   The procedure has been discussed with the patient. Alternatives to surgery have been discussed with the patient.  Risks of surgery include bleeding,  Infection,  Seroma formation, death,  and the need for further surgery.   The patient understands and wishes to proceed. Sentinel lymph node mapping and dissection has been discussed with the patient.  Risk of bleeding,  Infection,  Seroma formation,  Additional procedures,,  Shoulder weakness ,  Shoulder stiffness,  Nerve and blood vessel injury and reaction to the mapping dyes have been discussed.  Alternatives to surgery have been discussed with the patient.  The patient agrees to proceed.   Frans Valente A Jordane Hisle

## 2024-12-21 NOTE — Op Note (Signed)
 Preoperative diagnosis: Stage I right breast cancer upper inner quadrant ER positive  Postoperative diagnosis: Same  Procedure: Right breast seed localized lumpectomy with deep right axillary sentinel lymph node mapping with injection of mag trace  Surgeon: Debby Shipper, MD  Anesthesia: LMA with 0.25% Marcaine  with epinephrine  and pectoral block per anesthesia  EBL: Minimal  Specimen: Right breast tissue is seated clip verified by Faxitron, additional superior posterior margin sent separately.  There were 2 right axillary sentinel nodes deep level 1  Drains: None  Indications for procedure: The patient is a 54 year old female with stage I right breast cancer.  She was seen in the multidisciplinary clinic.  Options were discussed.  She opted for breast conserving surgery and reviewing all of her options.The procedure has been discussed with the patient. Alternatives to surgery have been discussed with the patient.  Risks of surgery include bleeding,  Infection,  Seroma formation, death,  and the need for further surgery.   The patient understands and wishes to proceed. Sentinel lymph node mapping and dissection has been discussed with the patient.  Risk of bleeding,  Infection,  Seroma formation,  Additional procedures,,  Shoulder weakness , lymphedema, cosmesis, shoulder stiffness,  Nerve and blood vessel injury and reaction to the mapping dyes have been discussed.  Alternatives to surgery have been discussed with the patient.  The patient agrees to proceed.     Description of procedure: The patient was met in the holding area and questions were answered.  Of note a seed was placed by radiology as an outpatient.  She underwent a pectoral block on the right per anesthesia.  All questions were answered.  She was taken back to the op room and placed upon upon the operating room table.  After induction of general anesthesia, 2 cc of mag tracer injected in the right breast deep tissue and massaged  for 5 minutes.  The right breast was then prepped and draped a second time and a second timeout performed.  The neoprobe was used to identify the seed in the right upper inner quadrant.  A transverse incision was made over the signal.  Dissection was carried down all tissue and the seed and clip were excised with grossly negative margins.  The deep and superior margins appeared close therefore excised separately and sent them after orientation with ink.  Imaging revealed the seed and clip to be in the specimen.  Hemostasis was achieved with cautery.  Local anesthetic infiltrated throughout.  The deep tissue planes were approximated with 3-0 Vicryl.  4 Monocryl was used to close the skin in a subcuticular fashion.  Dermabond applied.  The Sentimag probe was used.  There is an area of increased uptake in the right axilla identified.  A transverse 4 cm incision was made along the inferior hairline of the right axilla.  Dissection was carried down through the subcutaneous fat into the level 1 deep contents.  There were 2 nodes that had significant spike of activity compared to background.  These were removed.  Background counts were stable with no other spikes noted.  The long thoracic nerve, thoracodorsal trunk and axillary vein were all preserved.  Hemostasis achieved with Surgicel powder.  There is no bleeding.  The deep tissue planes were approximated with 3-0 Vicryl.  4-0 Monocryl was used to close the skin in a subcuticular fashion.  Dermabond was applied.  All counts found to be correct.  Breast binder placed.  The patient was awoke extubated taken to recovery in  satisfactory condition.

## 2024-12-21 NOTE — Anesthesia Postprocedure Evaluation (Signed)
"   Anesthesia Post Note  Patient: Kristina Hudson  Procedure(s) Performed: BREAST LUMPECTOMY WITH RADIOACTIVE SEED AND SENTINEL LYMPH NODE BIOPSY (Right: Breast)     Patient location during evaluation: Phase II Anesthesia Type: General Level of consciousness: awake and alert, oriented and patient cooperative Pain management: pain level controlled Vital Signs Assessment: post-procedure vital signs reviewed and stable Respiratory status: spontaneous breathing, nonlabored ventilation and respiratory function stable Cardiovascular status: blood pressure returned to baseline and stable Postop Assessment: no apparent nausea or vomiting, able to ambulate and adequate PO intake Anesthetic complications: no   No notable events documented.  Last Vitals:  Vitals:   12/21/24 0915 12/21/24 0930  BP: (!) 99/45 112/66  Pulse: 72 79  Resp: 12 (!) 9  Temp:    SpO2: 95% 96%    Last Pain:  Vitals:   12/21/24 0930  PainSc: 2                  Chitara Clonch,E. Fritzi Scripter      "

## 2024-12-22 ENCOUNTER — Encounter (HOSPITAL_BASED_OUTPATIENT_CLINIC_OR_DEPARTMENT_OTHER): Payer: Self-pay | Admitting: Surgery

## 2024-12-27 ENCOUNTER — Telehealth: Payer: Self-pay | Admitting: *Deleted

## 2024-12-27 NOTE — Telephone Encounter (Signed)
 Sent request to pathology to repeat prognostics on final pathology asap.

## 2024-12-28 ENCOUNTER — Ambulatory Visit: Payer: Self-pay | Admitting: Surgery

## 2024-12-28 LAB — SURGICAL PATHOLOGY

## 2024-12-30 ENCOUNTER — Encounter: Payer: Self-pay | Admitting: *Deleted

## 2025-01-13 ENCOUNTER — Inpatient Hospital Stay: Admitting: Hematology and Oncology
